# Patient Record
Sex: Female | Born: 1985 | Race: White | Hispanic: No | Marital: Married | Smoking: Never smoker
Health system: Southern US, Community
[De-identification: ages and names within clinical notes are randomized; demographics above are authoritative.]

## PROBLEM LIST (undated history)

## (undated) DIAGNOSIS — R87629 Unspecified abnormal cytological findings in specimens from vagina: Secondary | ICD-10-CM

## (undated) DIAGNOSIS — F419 Anxiety disorder, unspecified: Secondary | ICD-10-CM

## (undated) HISTORY — PX: LEEP: SHX91

## (undated) HISTORY — DX: Unspecified abnormal cytological findings in specimens from vagina: R87.629

---

## 2019-12-25 ENCOUNTER — Emergency Department (HOSPITAL_COMMUNITY): Payer: Medicaid Other

## 2019-12-25 ENCOUNTER — Encounter (HOSPITAL_COMMUNITY): Payer: Self-pay | Admitting: *Deleted

## 2019-12-25 ENCOUNTER — Emergency Department (HOSPITAL_BASED_OUTPATIENT_CLINIC_OR_DEPARTMENT_OTHER): Payer: Medicaid Other

## 2019-12-25 ENCOUNTER — Other Ambulatory Visit (HOSPITAL_COMMUNITY): Payer: Self-pay

## 2019-12-25 ENCOUNTER — Emergency Department (HOSPITAL_COMMUNITY)
Admission: EM | Admit: 2019-12-25 | Discharge: 2019-12-25 | Disposition: A | Discharge status: Home / Self Care | Payer: Medicaid Other | Attending: Emergency Medicine | Admitting: Emergency Medicine

## 2019-12-25 ENCOUNTER — Other Ambulatory Visit: Payer: Self-pay

## 2019-12-25 DIAGNOSIS — I340 Nonrheumatic mitral (valve) insufficiency: Secondary | ICD-10-CM | POA: Diagnosis not present

## 2019-12-25 DIAGNOSIS — R0602 Shortness of breath: Secondary | ICD-10-CM | POA: Insufficient documentation

## 2019-12-25 DIAGNOSIS — F419 Anxiety disorder, unspecified: Secondary | ICD-10-CM | POA: Diagnosis not present

## 2019-12-25 DIAGNOSIS — R0781 Pleurodynia: Secondary | ICD-10-CM | POA: Insufficient documentation

## 2019-12-25 DIAGNOSIS — Z20822 Contact with and (suspected) exposure to covid-19: Secondary | ICD-10-CM | POA: Diagnosis not present

## 2019-12-25 DIAGNOSIS — R012 Other cardiac sounds: Secondary | ICD-10-CM | POA: Insufficient documentation

## 2019-12-25 HISTORY — DX: Anxiety disorder, unspecified: F41.9

## 2019-12-25 LAB — BASIC METABOLIC PANEL
Anion gap: 16 — ABNORMAL HIGH (ref 5–15)
BUN: 13 mg/dL (ref 6–20)
CO2: 21 mmol/L — ABNORMAL LOW (ref 22–32)
Calcium: 10.1 mg/dL (ref 8.9–10.3)
Chloride: 105 mmol/L (ref 98–111)
Creatinine, Ser: 0.79 mg/dL (ref 0.44–1.00)
GFR calc Af Amer: >60 mL/min (ref >60)
GFR calc non Af Amer: >60 mL/min (ref >60)
Glucose, Bld: 138 mg/dL — ABNORMAL HIGH (ref 70–99)
Potassium: 3.5 mmol/L (ref 3.5–5.1)
Sodium: 142 mmol/L (ref 135–145)

## 2019-12-25 LAB — ECHOCARDIOGRAM COMPLETE
Area-P 1/2: 2.8 cm2
Height: 64 in
S' Lateral: 2.8 cm
Weight: 1600 oz

## 2019-12-25 LAB — TSH: TSH: 2.381 u[IU]/mL (ref 0.350–4.500)

## 2019-12-25 LAB — TROPONIN I (HIGH SENSITIVITY)
Troponin I (High Sensitivity): <2 ng/L (ref <18)
Troponin I (High Sensitivity): 2 ng/L (ref <18)

## 2019-12-25 LAB — I-STAT BETA HCG BLOOD, ED (NOT ORDERABLE): I-stat hCG, quantitative: <5 m[IU]/mL (ref <5)

## 2019-12-25 LAB — CBC
HCT: 42.7 % (ref 36.0–46.0)
Hemoglobin: 14.7 g/dL (ref 12.0–15.0)
MCH: 31.8 pg (ref 26.0–34.0)
MCHC: 34.4 g/dL (ref 30.0–36.0)
MCV: 92.4 fL (ref 80.0–100.0)
Platelets: 254 10*3/uL (ref 150–400)
RBC: 4.62 MIL/uL (ref 3.87–5.11)
RDW: 12.2 % (ref 11.5–15.5)
WBC: 9.2 10*3/uL (ref 4.0–10.5)
nRBC: 0 % (ref 0.0–0.2)

## 2019-12-25 LAB — HEPATIC FUNCTION PANEL
ALT: 18 U/L (ref 0–44)
AST: 18 U/L (ref 15–41)
Albumin: 4 g/dL (ref 3.5–5.0)
Alkaline Phosphatase: 49 U/L (ref 38–126)
Bilirubin, Direct: 0.1 mg/dL (ref 0.0–0.2)
Indirect Bilirubin: 0.6 mg/dL (ref 0.3–0.9)
Total Bilirubin: 0.7 mg/dL (ref 0.3–1.2)
Total Protein: 7.2 g/dL (ref 6.5–8.1)

## 2019-12-25 LAB — SARS CORONAVIRUS 2 BY RT PCR (HOSPITAL ORDER, PERFORMED IN ~~LOC~~ HOSPITAL LAB): SARS Coronavirus 2: NEGATIVE

## 2019-12-25 LAB — SEDIMENTATION RATE: Sed Rate: 6 mm/hr (ref 0–22)

## 2019-12-25 LAB — LIPASE, BLOOD: Lipase: 52 U/L — ABNORMAL HIGH (ref 11–51)

## 2019-12-25 LAB — D-DIMER, QUANTITATIVE: D-Dimer, Quant: 0.61 ug/mL-FEU — ABNORMAL HIGH (ref 0.00–0.50)

## 2019-12-25 IMAGING — CR DG CHEST 2V
3 series · 3 of 3 positions shown · non-contrast
Comparison: None.

CLINICAL DATA: Chest pain and shortness of breath with fevers

EXAM:
CHEST - 2 VIEW

[x chest ap (1 of 2)]
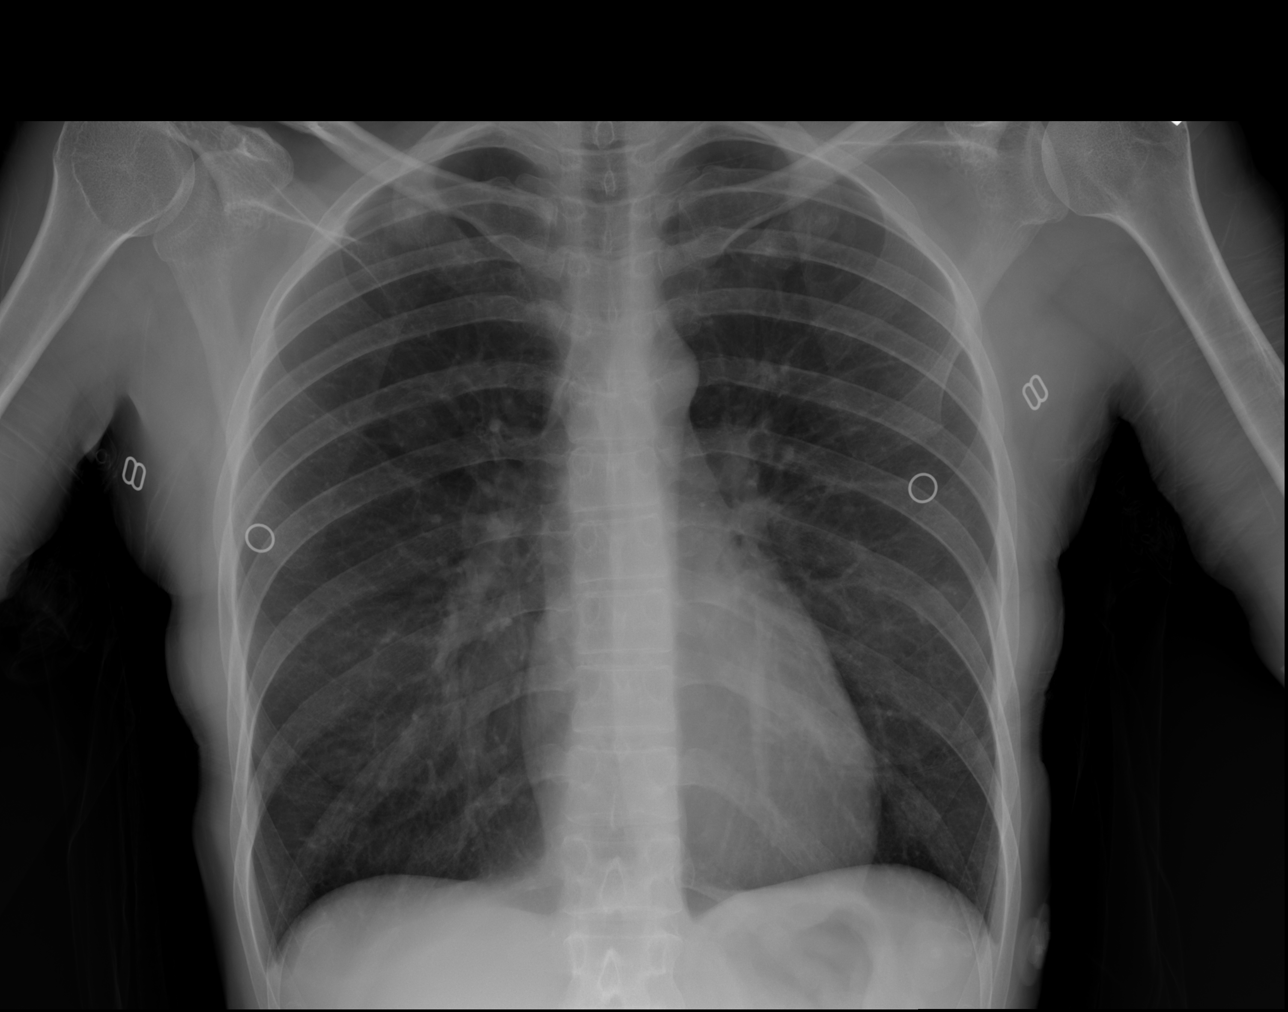

[x chest ap (2 of 2)]
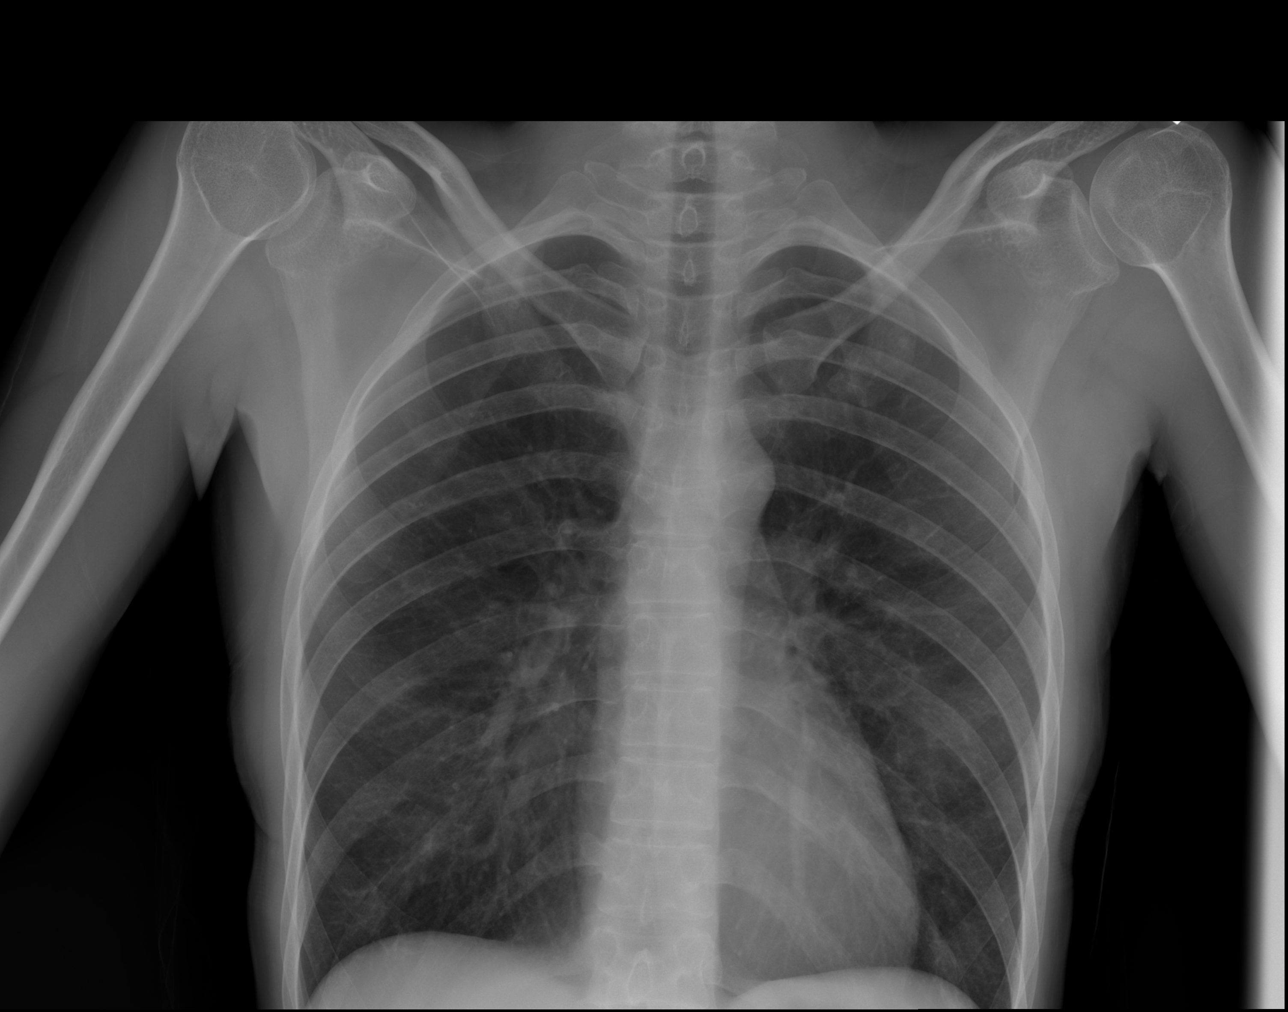

[w chest lat]
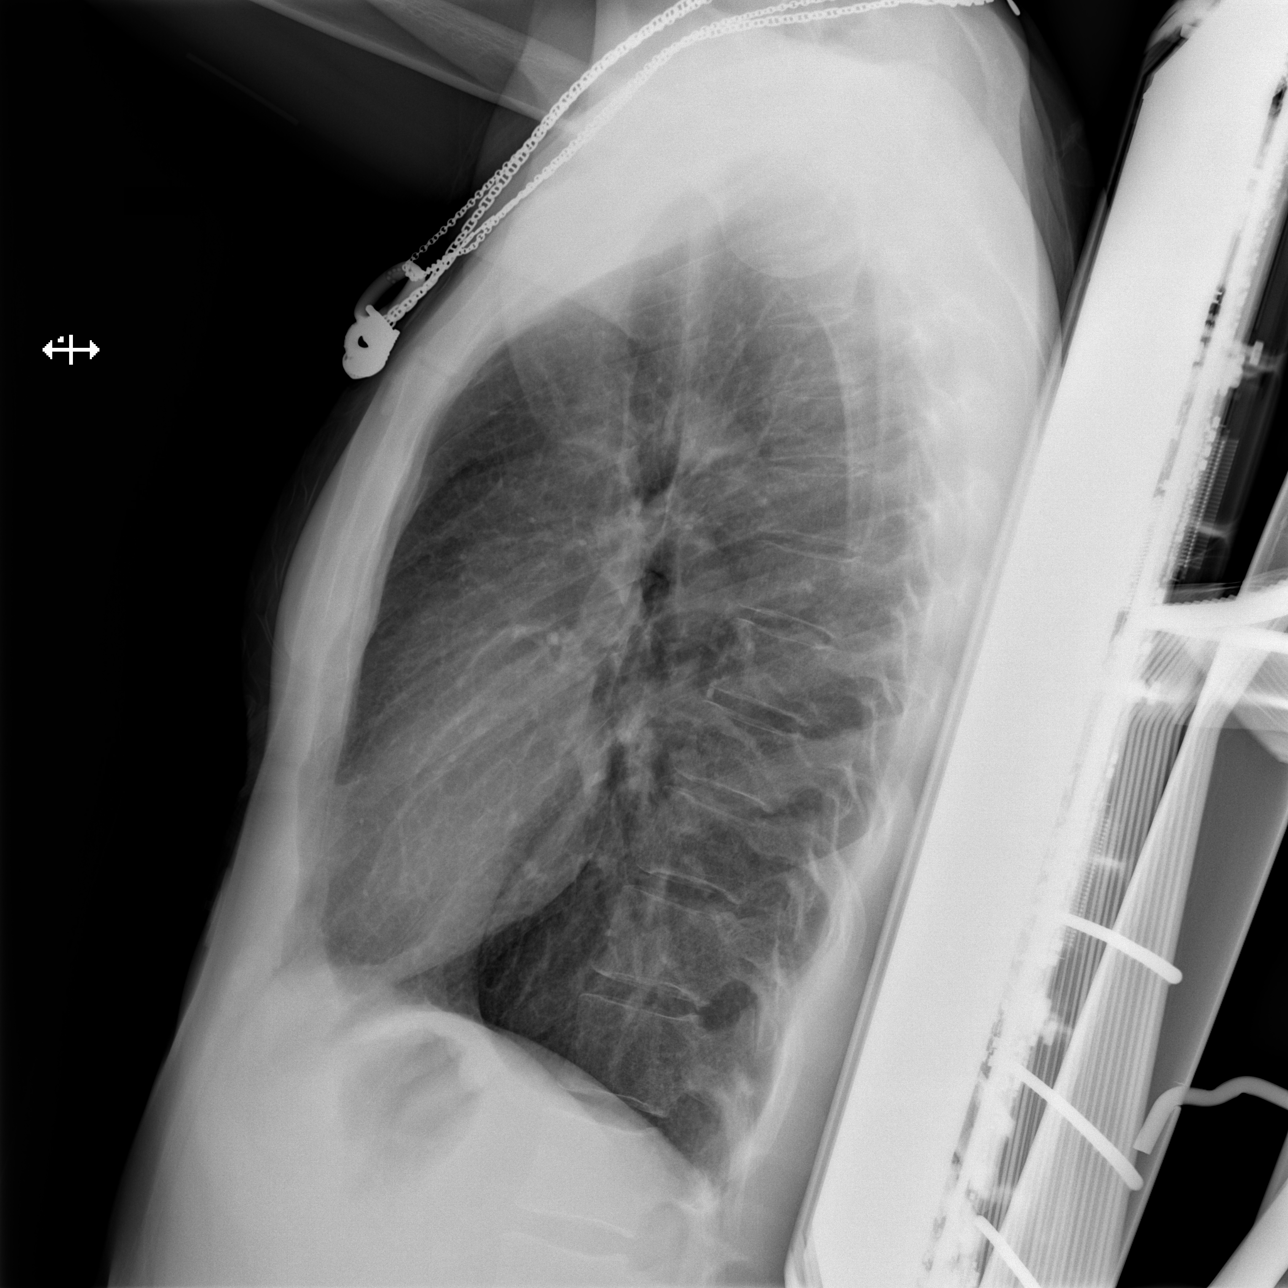

[3 of 3 positions shown; findings below may reference images not displayed]

FINDINGS: The heart size and mediastinal contours are within normal limits.
Both lungs are clear. The visualized skeletal structures are
unremarkable.
IMPRESSION: No active cardiopulmonary disease.

## 2019-12-25 IMAGING — CT CT ANGIO CHEST
2 of 6 series · 18 of 36 positions shown · IV contrast (omnipaque)
Comparison: [DATE]

CLINICAL DATA: Increasing shortness of breath for 2 days, headache,
chest pain

EXAM:
CT ANGIOGRAPHY CHEST WITH CONTRAST
TECHNIQUE: Multidetector CT imaging of the chest was performed using the
standard protocol during bolus administration of intravenous
contrast. Multiplanar CT image reconstructions and MIPs were
obtained to evaluate the vascular anatomy.
CONTRAST:  100mL OMNIPAQUE IOHEXOL 350 MG/ML SOLN

[Series 6: thins · axial · 0.62mm/px · z∈[+1410,+1682]mm · 17 of 308 slices shown]
[im 18/308  lung]
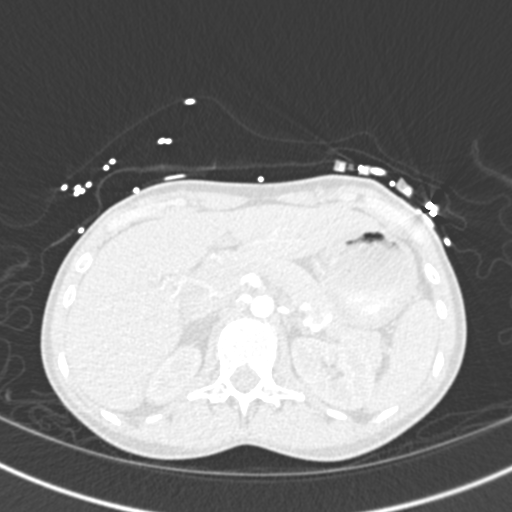
[im 35/308  mediastinal]
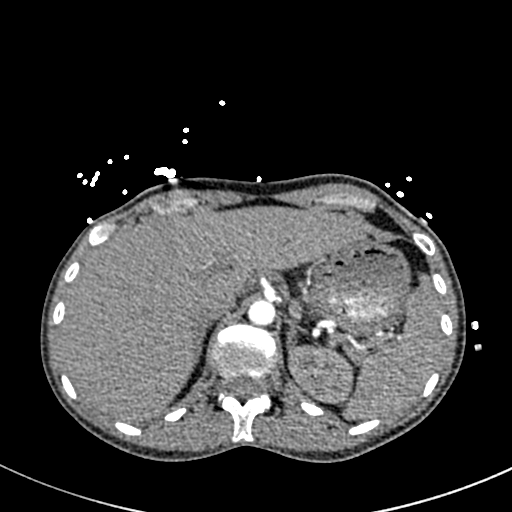
[im 52/308  lung]
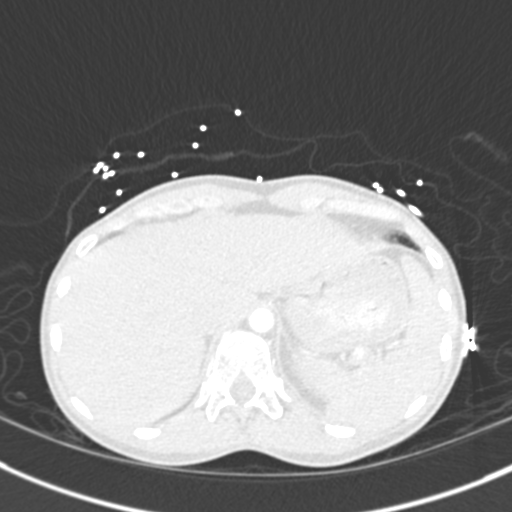
[im 69/308  mediastinal]
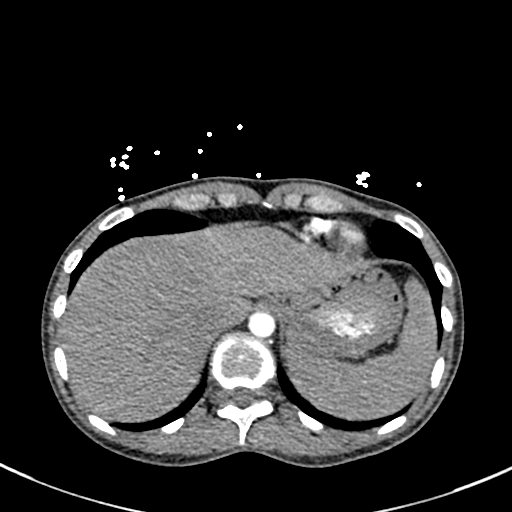
[im 86/308  lung]
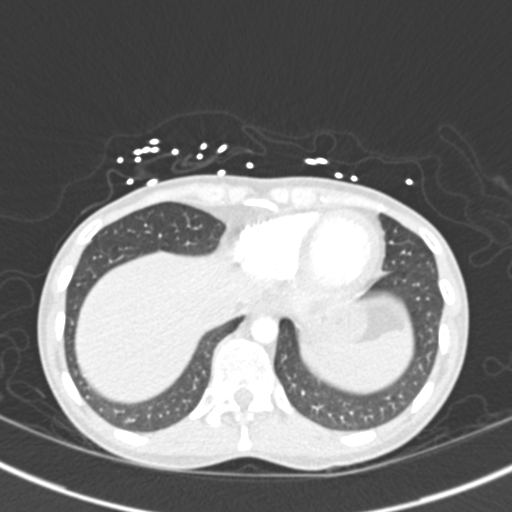
[im 103/308  mediastinal]
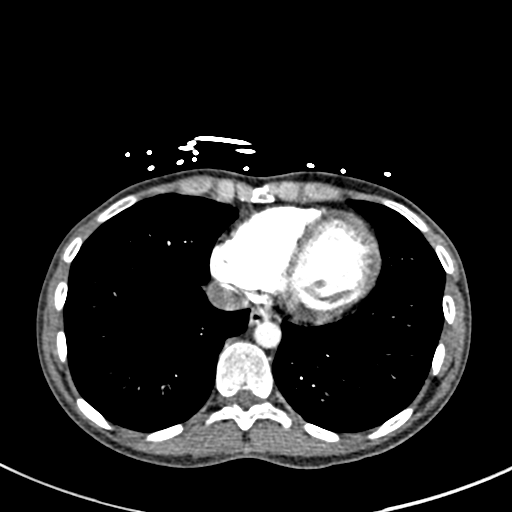
[im 120/308  lung]
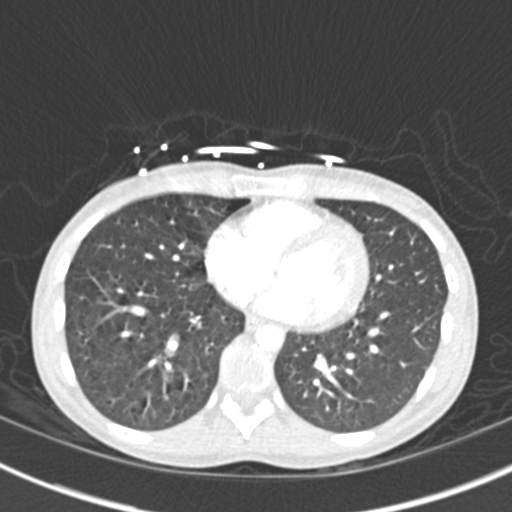
[im 137/308  mediastinal]
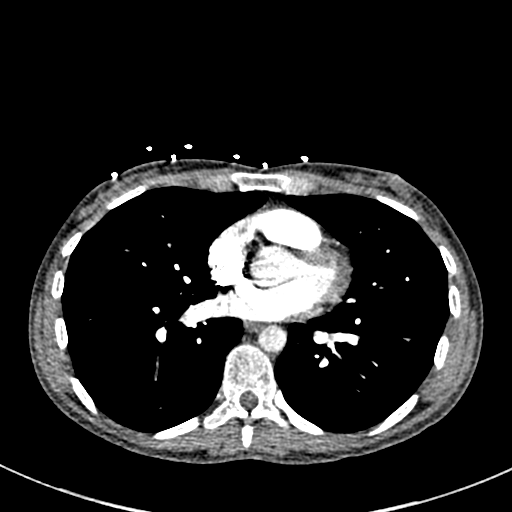
[im 154/308  lung]
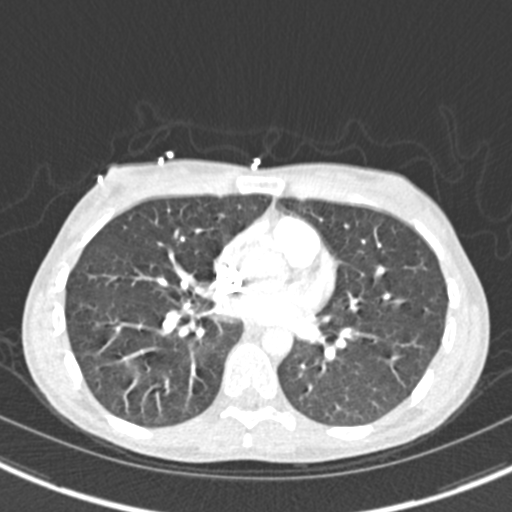
[im 171/308  mediastinal]
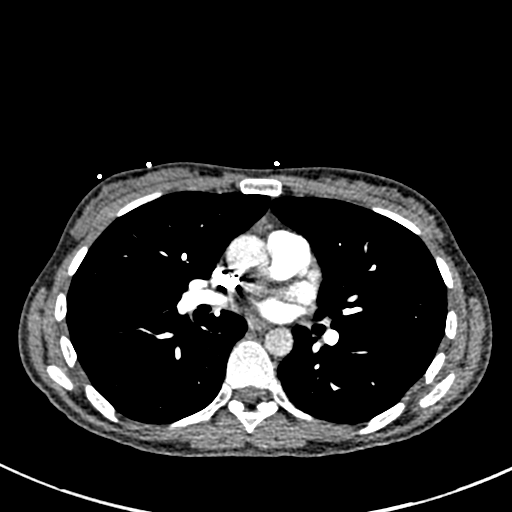
[im 188/308  lung]
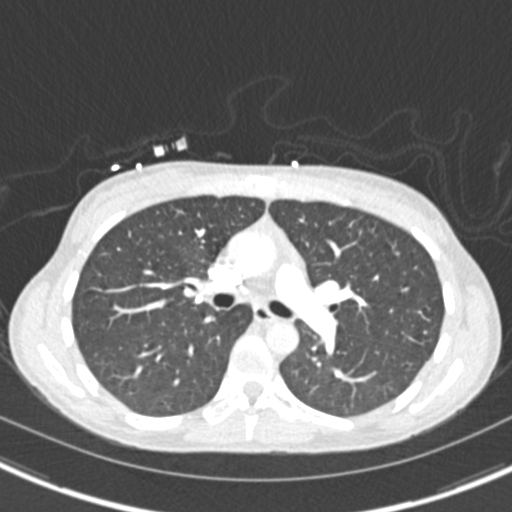
[im 205/308  mediastinal]
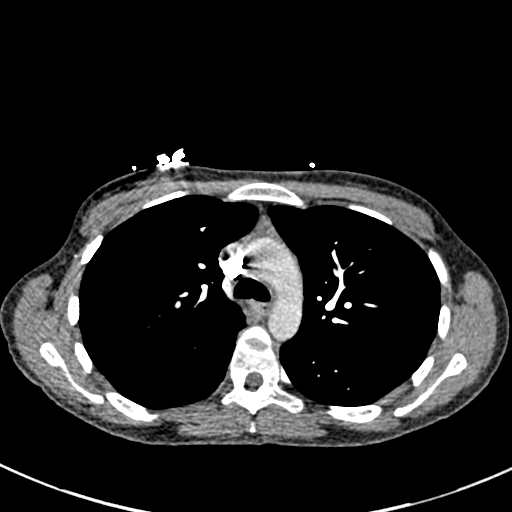
[im 222/308  lung]
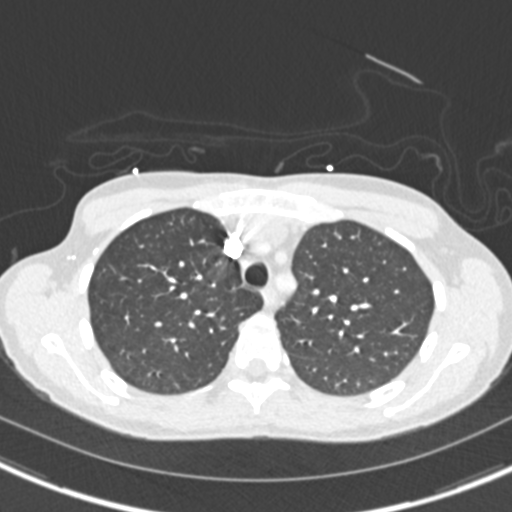
[im 239/308  mediastinal]
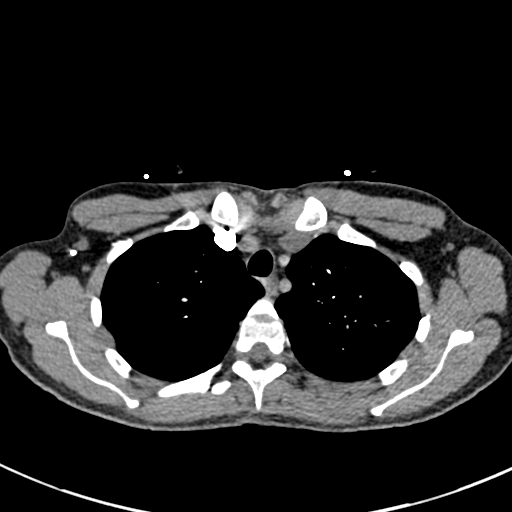
[im 256/308  lung]
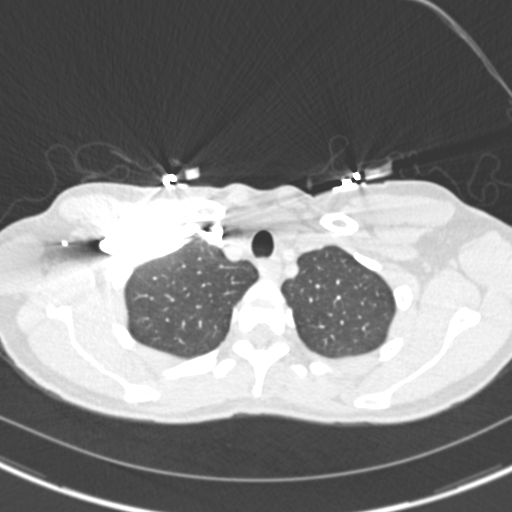
[im 273/308  mediastinal]
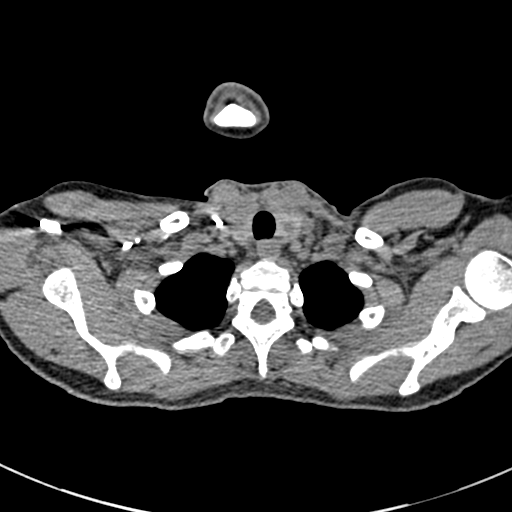
[im 290/308  lung]
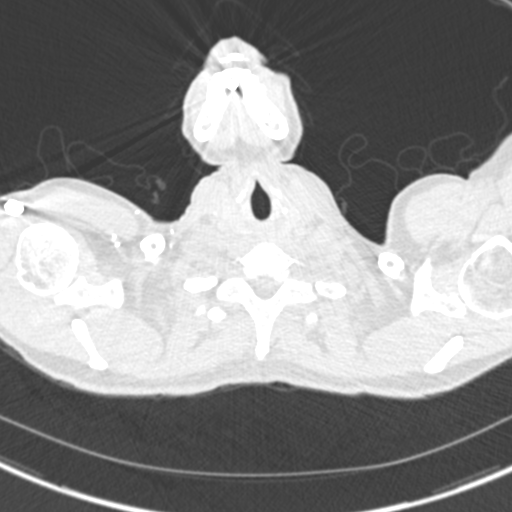

[Series 8: coronal mpr · coronal · 0.54mm/px · 1 of 94 slices shown]
[im 47/94  mediastinal]
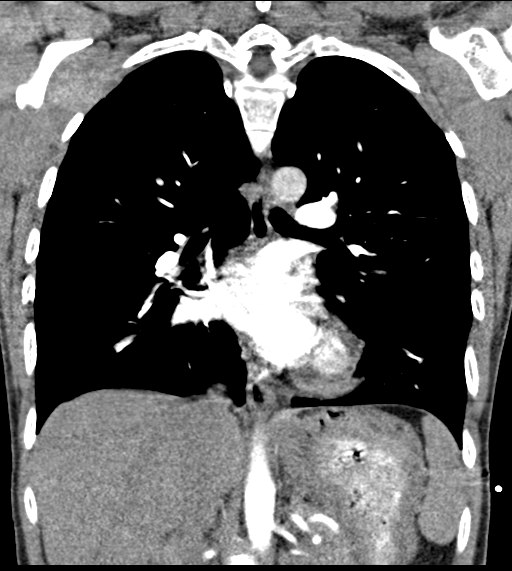

[18 of 36 positions shown; findings below may reference images not displayed]

FINDINGS: Cardiovascular: This is a technically adequate evaluation of the
pulmonary vasculature. No filling defects or pulmonary emboli.

The heart is unremarkable without pericardial effusion. Normal
caliber of the thoracic aorta.

Mediastinum/Nodes: No enlarged mediastinal, hilar, or axillary lymph
nodes. Thyroid gland, trachea, and esophagus demonstrate no
significant findings.

Lungs/Pleura: No acute airspace disease, effusion, or pneumothorax.
Central airways are widely patent.

Upper Abdomen: No acute abnormality.

Musculoskeletal: No acute or destructive bony lesions. Reconstructed
images demonstrate no additional findings.

Review of the MIP images confirms the above findings.
IMPRESSION: 1. No evidence of pulmonary embolus.
2. No acute intrathoracic process.

## 2019-12-25 MED ORDER — OMEPRAZOLE 20 MG PO CPDR: 20.0000 mg | DELAYED_RELEASE_CAPSULE | Freq: Every day | ORAL | 0 refills | Status: DC

## 2019-12-25 MED ORDER — LORAZEPAM 2 MG/ML IJ SOLN
0.5000 mg | Freq: Once | INTRAMUSCULAR | Status: AC
  Administered 2019-12-25: 0.5 mg via INTRAVENOUS
  Filled 2019-12-25: qty 1

## 2019-12-25 MED ORDER — IOHEXOL 350 MG/ML SOLN
100.0000 mL | Freq: Once | INTRAVENOUS | Status: AC | PRN
  Administered 2019-12-25: 100 mL via INTRAVENOUS

## 2019-12-25 MED ORDER — FAMOTIDINE IN NACL 20-0.9 MG/50ML-% IV SOLN
20.0000 mg | Freq: Once | INTRAVENOUS | Status: AC
  Administered 2019-12-25: 20 mg via INTRAVENOUS
  Filled 2019-12-25: qty 50

## 2019-12-25 MED ORDER — KETOROLAC TROMETHAMINE 30 MG/ML IJ SOLN
30.0000 mg | Freq: Once | INTRAMUSCULAR | Status: AC
  Administered 2019-12-25: 30 mg via INTRAVENOUS
  Filled 2019-12-25: qty 1

## 2019-12-25 NOTE — ED Provider Notes (Signed)
[facility]-EMERGENCY DEPT Provider Note   CSN: [id number] Arrival date & time: 12/25/19  7829     History Chief Complaint  Patient presents with  . Shortness of Breath    Susan Neal is a 34 y.o. female.  HPI Patient has no significant past medical history.  She reports she developed shortness of breath over the past couple of days.  She thought it was because she is just so busy and active.  However, it has become much more intense and she is developed heavy chest pain just to the right of the sternum.  She reports when she takes a deep breath or exerts herself at all it feels like there is a deep pressure on her chest.  She reports since last night also she has been getting tingling and numb feeling in all 4 extremities.  She tries to stretch and move them to alleviate this sensation.  She reports she is felt really cold but she has not had a fever.  She reports when other people touch her she feels warm to them but to herself her extremities feel cold.  He had called urgent care and they thought maybe it was due to her Nexplanon.  She has had this Nexplanon for 2 years.  She has had 3 of them and never had problems previously.  She reports they can think of another reason for her to have the symptoms all of a sudden.  Patient reports she and her family are extremely careful with isolating to avoid contracting Covid.  He has a son who has pseudotumor cerebri and she is very careful with exposures.  However, due to concerns for safety of the vaccine, she has not been vaccinated for Covid.  Patient has not had any myalgias, diarrhea, vomiting or general URI symptoms.  Non-smoker.  Very rare alcohol use.    Past Medical History:  Diagnosis Date  . Anxiety     There are no problems to display for this patient.   Past Surgical History:  Procedure Laterality Date  . leap       OB History   No obstetric history on file.     No family history on  file.  Social History   Tobacco Use  . Smoking status: Never Smoker  . Smokeless tobacco: Never Used  Substance Use Topics  . Alcohol use: Yes  . Drug use: Never    Home Medications Prior to Admission medications   Medication Sig Start Date End Date Taking? Authorizing Provider  omeprazole (PRILOSEC) 20 MG capsule Take 1 capsule (20 mg total) by mouth daily. 12/25/19   Arby Barrette, MD    Allergies    Tramadol  Review of Systems   Review of Systems 10 systems reviewed and negative except as per HPI Physical Exam Updated Vital Signs BP 111/78   Pulse 85   Temp 98.4 F (36.9 C) (Oral)   Resp (!) 21   Ht  (1.626 m)   Wt 45.4 kg   SpO2 98%   BMI 17.16 kg/m   Physical Exam Constitutional:      Comments: Patient is alert.  Well-nourished well-developed.  Good physical condition.  Mildly anxious in appearance.  Nontoxic.  HENT:     Head: Normocephalic and atraumatic.     Mouth/Throat:     Pharynx: Oropharynx is clear.  Eyes:     Extraocular Movements: Extraocular movements intact.  Cardiovascular:     Comments: Borderline tachycardia.  Pronounced split  S2.  Radial pulse is symmetric but 1+, feel slightly soft relative to patient's age and physical condition.  Pedal pulses also slightly soft to palpation.  1+. Pulmonary:     Breath sounds: Normal breath sounds.     Comments: Mild tachypnea. Abdominal:     General: There is no distension.     Palpations: Abdomen is soft.     Tenderness: There is no abdominal tenderness. There is no guarding.  Musculoskeletal:        General: No swelling or tenderness. Normal range of motion.     Cervical back: Neck supple.     Right lower leg: No edema.     Left lower leg: No edema.  Skin:    General: Skin is warm and dry.  Neurological:     General: No focal deficit present.     Mental Status: She is oriented to person, place, and time.     Cranial Nerves: No cranial nerve deficit.     Coordination: Coordination  normal.  Psychiatric:     Comments: Mildly anxious.     ED Results / Procedures / Treatments   Labs (all labs ordered are listed, but only abnormal results are displayed) Labs Reviewed  BASIC METABOLIC PANEL - Abnormal; Notable for the following components:      Result Value   CO2 21 (*)    Glucose, Bld 138 (*)    Anion gap 16 (*)    All other components within normal limits  D-DIMER, QUANTITATIVE (NOT AT [facility]) - Abnormal; Notable for the following components:   D-Dimer, Quant 0.61 (*)    All other components within normal limits  LIPASE, BLOOD - Abnormal; Notable for the following components:   Lipase 52 (*)    All other components within normal limits  SARS CORONAVIRUS 2 BY RT PCR (HOSPITAL ORDER, PERFORMED IN [facility])  CBC  TSH  SEDIMENTATION RATE  HEPATIC FUNCTION PANEL  I-STAT BETA HCG BLOOD, ED ([facility], [facility], [facility] ONLY)  I-STAT BETA HCG BLOOD, ED (NOT ORDERABLE)  TROPONIN I (HIGH SENSITIVITY)  TROPONIN I (HIGH SENSITIVITY)    EKG EKG Interpretation  Date/Time:  Wednesday December 25 2019 16:13:01 EDT Ventricular Rate:  81 PR Interval:    QRS Duration: 87 QT Interval:  397 QTC Calculation: 461 R Axis:   77 Text Interpretation: Sinus rhythm Borderline ST depression, anterolateral leads Baseline wander in lead(s) V5 V6 agree, no old comparison Confirmed by Arby BarrettePfeiffer, Eilidh Marcano [phone]([id number]) on 12/25/2019 4:25:34 PM   Radiology DG Chest 2 View  Result Date: 12/25/2019 CLINICAL DATA:  Chest pain and shortness of breath with fevers EXAM: CHEST - 2 VIEW COMPARISON:  None. FINDINGS: The heart size and mediastinal contours are within normal limits. Both lungs are clear. The visualized skeletal structures are unremarkable. IMPRESSION: No active cardiopulmonary disease. Electronically Signed   By: Alcide CleverMark  Lukens M.D.   On: 12/25/2019 09:43   CT Angio Chest PE W/Cm &/Or Wo Cm  Result Date: 12/25/2019 CLINICAL DATA:  Increasing shortness of breath for 2 days, headache, chest  pain EXAM: CT ANGIOGRAPHY CHEST WITH CONTRAST TECHNIQUE: Multidetector CT imaging of the chest was performed using the standard protocol during bolus administration of intravenous contrast. Multiplanar CT image reconstructions and MIPs were obtained to evaluate the vascular anatomy. CONTRAST:  100mL OMNIPAQUE IOHEXOL 350 MG/ML SOLN COMPARISON:  12/25/2019 FINDINGS: Cardiovascular: This is a technically adequate evaluation of the pulmonary vasculature. No filling defects or pulmonary emboli. The heart is unremarkable without pericardial  effusion. Normal caliber of the thoracic aorta. Mediastinum/Nodes: No enlarged mediastinal, hilar, or axillary lymph nodes. Thyroid gland, trachea, and esophagus demonstrate no significant findings. Lungs/Pleura: No acute airspace disease, effusion, or pneumothorax. Central airways are widely patent. Upper Abdomen: No acute abnormality. Musculoskeletal: No acute or destructive bony lesions. Reconstructed images demonstrate no additional findings. Review of the MIP images confirms the above findings. IMPRESSION: 1. No evidence of pulmonary embolus. 2. No acute intrathoracic process. Electronically Signed   By: Sharlet Salina M.D.   On: 12/25/2019 20:56   ECHOCARDIOGRAM COMPLETE  Result Date: 12/25/2019    ECHOCARDIOGRAM REPORT   Patient Name:   Susan Neal Date of Exam: 12/25/2019 Medical Rec #:  [id number]        Height:       64.0 in Accession #:    [id number]       Weight:       100.0 lb Date of Birth:  02-Oct-1985         BSA:          1.457 m Patient Age:    34 years         BP:           121/82 mmHg Patient Gender: F                HR:           79 bpm. Exam Location:  Inpatient Procedure: 2D Echo STAT ECHO Indications:    chest pain  History:        Patient has no prior history of Echocardiogram examinations.  Sonographer:    Celene Skeen RDCS (AE) Referring Phys: [id number] [facility] Cheyrl Buley  Sonographer Comments: thin body habitus IMPRESSIONS  1. Left ventricular ejection  fraction, by estimation, is 60 to 65%. The left ventricle has normal function. The left ventricle has no regional wall motion abnormalities. Left ventricular diastolic parameters were normal.  2. Right ventricular systolic function is normal. The right ventricular size is normal.  3. The mitral valve is normal in structure. Mild mitral valve regurgitation. No evidence of mitral stenosis.  4. The aortic valve is normal in structure. Aortic valve regurgitation is not visualized. No aortic stenosis is present. FINDINGS  Left Ventricle: Left ventricular ejection fraction, by estimation, is 60 to 65%. The left ventricle has normal function. The left ventricle has no regional wall motion abnormalities. The left ventricular internal cavity size was normal in size. There is  no left ventricular hypertrophy. Left ventricular diastolic parameters were normal. Right Ventricle: The right ventricular size is normal. No increase in right ventricular wall thickness. Right ventricular systolic function is normal. Left Atrium: Left atrial size was normal in size. Right Atrium: Right atrial size was normal in size. Pericardium: There is no evidence of pericardial effusion. Mitral Valve: The mitral valve is normal in structure. Mild mitral valve regurgitation. No evidence of mitral valve stenosis. Tricuspid Valve: The tricuspid valve is grossly normal. Tricuspid valve regurgitation is not demonstrated. No evidence of tricuspid stenosis. Aortic Valve: The aortic valve is normal in structure. Aortic valve regurgitation is not visualized. No aortic stenosis is present. Pulmonic Valve: The pulmonic valve was normal in structure. Pulmonic valve regurgitation is not visualized. Aorta: The aortic root and ascending aorta are structurally normal, with no evidence of dilitation. IAS/Shunts: The atrial septum is grossly normal.  LEFT VENTRICLE PLAX 2D LVIDd:         4.30 cm  Diastology LVIDs:  2.80 cm  LV e' medial:    9.03 cm/s LV PW:          0.70 cm  LV E/e' medial:  8.0 LV IVS:        0.60 cm  LV e' lateral:   14.00 cm/s LVOT diam:     1.65 cm  LV E/e' lateral: 5.1 LV SV:         35 LV SV Index:   24 LVOT Area:     2.14 cm  RIGHT VENTRICLE RV S prime:     12.90 cm/s TAPSE (M-mode): 1.9 cm LEFT ATRIUM           Index      RIGHT ATRIUM          Index LA diam:      2.10 cm 1.44 cm/m RA Area:     7.72 cm LA Vol (A4C): 10.9 ml 7.48 ml/m RA Volume:   12.30 ml 8.44 ml/m  AORTIC VALVE LVOT Vmax:   94.25 cm/s LVOT Vmean:  61.000 cm/s LVOT VTI:    0.163 m  AORTA Ao Root diam: 2.50 cm MITRAL VALVE MV Area (PHT): 2.80 cm    SHUNTS MV Decel Time: 271 msec    Systemic VTI:  0.16 m MV E velocity: 72.00 cm/s  Systemic Diam: 1.65 cm MV A velocity: 72.40 cm/s MV E/A ratio:  0.99 Kristeen Miss MD Electronically signed by Kristeen Miss MD Signature Date/Time: 12/25/2019/5:04:49 PM    Final     Procedures Procedures (including critical care time)  Medications Ordered in ED Medications  famotidine (PEPCID) IVPB 20 mg premix (0 mg Intravenous Stopped 12/25/19 2103)  LORazepam (ATIVAN) injection 0.5 mg (0.5 mg Intravenous Given 12/25/19 1942)  iohexol (OMNIPAQUE) 350 MG/ML injection 100 mL (100 mLs Intravenous Contrast Given 12/25/19 2041)  ketorolac (TORADOL) 30 MG/ML injection 30 mg (30 mg Intravenous Given 12/25/19 2128)    ED Course  I have reviewed the triage vital signs and the nursing notes.  Pertinent labs & imaging results that were available during my care of the patient were reviewed by me and considered in my medical decision making (see chart for details).    MDM Rules/Calculators/A&P                         Patient has been developing short of breath for 2 days.  She has not had fever or productive cough.  She has developed significant pressure like chest pain in the anterior chest with deep breath and exertion.  Physical exam is for clear lungs.  Patient heart sounds however do have pronounced split S2.  Consideration of physiologic  finding however with patient's symptoms of significant chest pain and shortness of breath, will proceed with cardiac echo for valvular evaluation and rule out pericardial effusion.  Chest x-ray does not show cardiomegaly.  Patient is not hypotensive or hypoxic at rest.  At this time, stable for further diagnostic evaluation.  Will also add Covid testing.  Diagnostic evaluation is within normal limits.  CT chest does not show any abnormal findings.  Combined findings of echo and CT, very low probability of any cardiac or pulmonary etiology.  Patient may be experiencing reflux and esophageal spasm or possibly have gastritis or ulcer disease.  Recommend starting omeprazole daily and following dietary recommendations for reflux.  Patient is also instructed to take Tylenol for chest wall pain.  Symptoms of tingling of the hands and feet all the  same time is consistent with hyperventilation.  Patient is counseled on breathing patterns.  At this time patient is stable for discharge.  We have discussed a detailed plan for symptomatic treatment and necessity for establishing follow-up for further diagnostic evaluation and recheck.  Return precautions reviewed.  Susan Neal was evaluated in Emergency Department on 12/25/2019 for the symptoms described in the history of present illness. She was evaluated in the context of the global COVID-19 pandemic, which necessitated consideration that the patient might be at risk for infection with the SARS-CoV-2 virus that causes COVID-19. Institutional protocols and algorithms that pertain to the evaluation of patients at risk for COVID-19 are in a state of rapid change based on information released by regulatory bodies including the CDC and federal and state organizations. These policies and algorithms were followed during the patient's care in the ED.   Final Clinical Impression(s) / ED Diagnoses Final diagnoses:  Shortness of breath  Pleuritic pain  Split S2 (second  heart sound)    Rx / DC Orders ED Discharge Orders         Ordered    omeprazole (PRILOSEC) 20 MG capsule  Daily        12/25/19 2146           Arby Barrette, MD 12/25/19 2157

## 2019-12-25 NOTE — Discharge Instructions (Signed)
1.  Take omeprazole in the morning 30 minutes before you eat.  Eat a low-fat nutritious breakfast. 2.  Follow eating instructions for gastroesophageal reflux disease. 3.  You may take extra strength Tylenol every 6 hours for pain. 4.  Set up an appointment with a family doctor for recheck and referral if needed for upper endoscopy.  If you have significant gastritis or an ulcer, this will need to be diagnosed by a gastroenterologist doing a procedure called an upper endoscopy.  You are sedated and a lighted scope is used to examine the lining of the esophagus and the stomach. 5.  You had an echocardiogram and a CT scan of the chest done in the emergency department.  Both of these studies were normal.  If you have persisting pain or new symptoms, you may need additional diagnostic evaluation.  Make sure you get established with family doctor for appropriate referrals.  If your symptoms are worsening or changing, return to the emergency department.

## 2019-12-25 NOTE — ED Notes (Signed)
ECHO at bedside.

## 2019-12-25 NOTE — Progress Notes (Signed)
  Echocardiogram 2D Echocardiogram has been performed.  Celene Skeen 12/25/2019, 4:45 PM

## 2019-12-25 NOTE — ED Triage Notes (Signed)
Patient reports for the past 2 days she has noted increased sob.  She states she has been busy the past 2 days and thought her sx were related to being so busy.  Last night she developed a headache, chest pain, sob, tingling, numbness in both arms and legs.  Patient is alert.  She is noted to be sob at rest.  Patient denies any fever but states she has felt really cold.  Patient has had no cough, no n/v.  Patient states her legs feel weak or wobbly.  She does have implant BC in the left arm

## 2020-04-11 NOTE — L&D Delivery Note (Signed)
Patient was C/C/0 and pushed for approx 5 minutes with epidural.    NSVD female infant, Apgars 9/9 weight pending.  The patient had bilateral labial abrasions not requiring repair. Fundus was firm. EBL was expected amount. Placenta was delivered intact. Vagina was clear.  Delayed cord clamping done for 30-60 seconds while warming baby. Baby was vigorous and doing skin to skin with mother.  Susan Neal

## 2020-04-28 LAB — OB RESULTS CONSOLE HIV ANTIBODY (ROUTINE TESTING): HIV: NONREACTIVE

## 2020-04-28 LAB — OB RESULTS CONSOLE ANTIBODY SCREEN: Antibody Screen: NEGATIVE

## 2020-04-28 LAB — OB RESULTS CONSOLE ABO/RH: RH Type: POSITIVE

## 2020-04-28 LAB — OB RESULTS CONSOLE HEPATITIS B SURFACE ANTIGEN: Hepatitis B Surface Ag: NEGATIVE

## 2020-04-28 LAB — OB RESULTS CONSOLE RPR: RPR: NONREACTIVE

## 2020-04-28 LAB — OB RESULTS CONSOLE RUBELLA ANTIBODY, IGM: Rubella: IMMUNE

## 2020-06-11 ENCOUNTER — Other Ambulatory Visit: Payer: Self-pay | Admitting: Obstetrics and Gynecology

## 2020-06-11 DIAGNOSIS — Z363 Encounter for antenatal screening for malformations: Secondary | ICD-10-CM

## 2020-07-02 ENCOUNTER — Emergency Department (HOSPITAL_COMMUNITY)
Admission: EM | Admit: 2020-07-02 | Discharge: 2020-07-03 | Disposition: A | Discharge status: Home / Self Care | Payer: Medicaid Other | Attending: Emergency Medicine | Admitting: Emergency Medicine

## 2020-07-02 ENCOUNTER — Emergency Department (HOSPITAL_COMMUNITY): Payer: Medicaid Other

## 2020-07-02 ENCOUNTER — Other Ambulatory Visit: Payer: Self-pay

## 2020-07-02 ENCOUNTER — Encounter (HOSPITAL_COMMUNITY): Payer: Self-pay

## 2020-07-02 DIAGNOSIS — R000 Tachycardia, unspecified: Secondary | ICD-10-CM | POA: Insufficient documentation

## 2020-07-02 DIAGNOSIS — R55 Syncope and collapse: Secondary | ICD-10-CM | POA: Insufficient documentation

## 2020-07-02 DIAGNOSIS — K469 Unspecified abdominal hernia without obstruction or gangrene: Secondary | ICD-10-CM | POA: Diagnosis not present

## 2020-07-02 DIAGNOSIS — R1031 Right lower quadrant pain: Secondary | ICD-10-CM

## 2020-07-02 DIAGNOSIS — O26892 Other specified pregnancy related conditions, second trimester: Secondary | ICD-10-CM | POA: Insufficient documentation

## 2020-07-02 DIAGNOSIS — Z3A18 18 weeks gestation of pregnancy: Secondary | ICD-10-CM | POA: Diagnosis not present

## 2020-07-02 LAB — CBC WITH DIFFERENTIAL/PLATELET
Abs Immature Granulocytes: 0.07 10*3/uL (ref 0.00–0.07)
Basophils Absolute: 0 10*3/uL (ref 0.0–0.1)
Basophils Relative: 0 %
Eosinophils Absolute: 0.1 10*3/uL (ref 0.0–0.5)
Eosinophils Relative: 1 %
HCT: 33 % — ABNORMAL LOW (ref 36.0–46.0)
Hemoglobin: 11.4 g/dL — ABNORMAL LOW (ref 12.0–15.0)
Immature Granulocytes: 1 %
Lymphocytes Relative: 15 %
Lymphs Abs: 1.7 10*3/uL (ref 0.7–4.0)
MCH: 32.8 pg (ref 26.0–34.0)
MCHC: 34.5 g/dL (ref 30.0–36.0)
MCV: 94.8 fL (ref 80.0–100.0)
Monocytes Absolute: 0.6 10*3/uL (ref 0.1–1.0)
Monocytes Relative: 5 %
Neutro Abs: 8.6 10*3/uL — ABNORMAL HIGH (ref 1.7–7.7)
Neutrophils Relative %: 78 %
Platelets: 252 10*3/uL (ref 150–400)
RBC: 3.48 MIL/uL — ABNORMAL LOW (ref 3.87–5.11)
RDW: 14.6 % (ref 11.5–15.5)
WBC: 11 10*3/uL — ABNORMAL HIGH (ref 4.0–10.5)
nRBC: 0 % (ref 0.0–0.2)

## 2020-07-02 LAB — COMPREHENSIVE METABOLIC PANEL
ALT: 14 U/L (ref 0–44)
AST: 19 U/L (ref 15–41)
Albumin: 4 g/dL (ref 3.5–5.0)
Alkaline Phosphatase: 47 U/L (ref 38–126)
Anion gap: 8 (ref 5–15)
BUN: 10 mg/dL (ref 6–20)
CO2: 20 mmol/L — ABNORMAL LOW (ref 22–32)
Calcium: 9.2 mg/dL (ref 8.9–10.3)
Chloride: 105 mmol/L (ref 98–111)
Creatinine, Ser: 0.57 mg/dL (ref 0.44–1.00)
GFR, Estimated: >60 mL/min (ref >60)
Glucose, Bld: 105 mg/dL — ABNORMAL HIGH (ref 70–99)
Potassium: 3.7 mmol/L (ref 3.5–5.1)
Sodium: 133 mmol/L — ABNORMAL LOW (ref 135–145)
Total Bilirubin: 0.6 mg/dL (ref 0.3–1.2)
Total Protein: 7.9 g/dL (ref 6.5–8.1)

## 2020-07-02 LAB — CBG MONITORING, ED: Glucose-Capillary: 95 mg/dL (ref 70–99)

## 2020-07-02 IMAGING — US US ART/VEN ABD/PELV/SCROTUM DOPPLER LTD
1 series · 13 of 25 positions shown · non-contrast
Comparison: none

CLINICAL DATA: 35-year-old pregnant female with right lower
quadrant abdominal pain. Concern for ovarian torsion. Provided
gestational age of 18 weeks, 4 days.

EXAM:
LIMITED OBSTETRIC ULTRASOUND AN DOPPLER

[Series 1: us art/ven abd/pelv/scrotum doppler ltd · 13 of 58 slices shown]
[im 1/58]
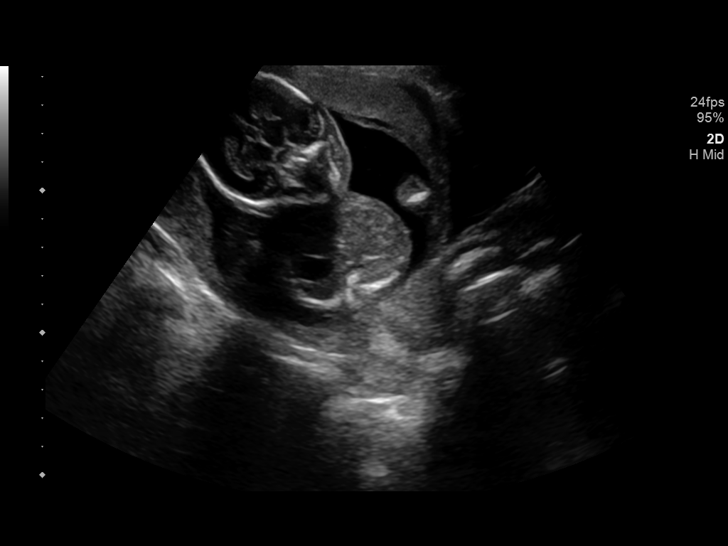
[im 5/58]
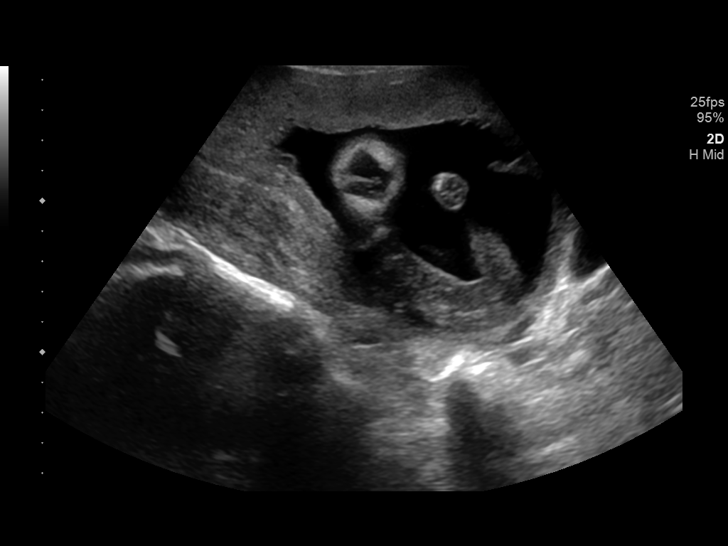
[im 10/58]
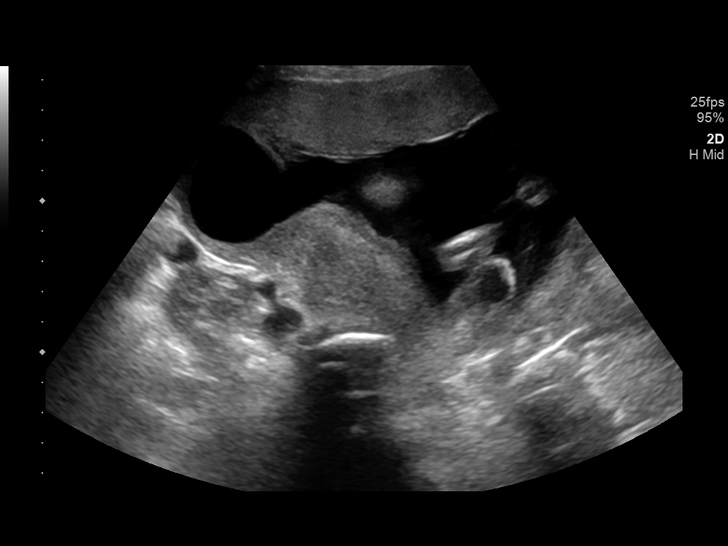
[im 15/58]
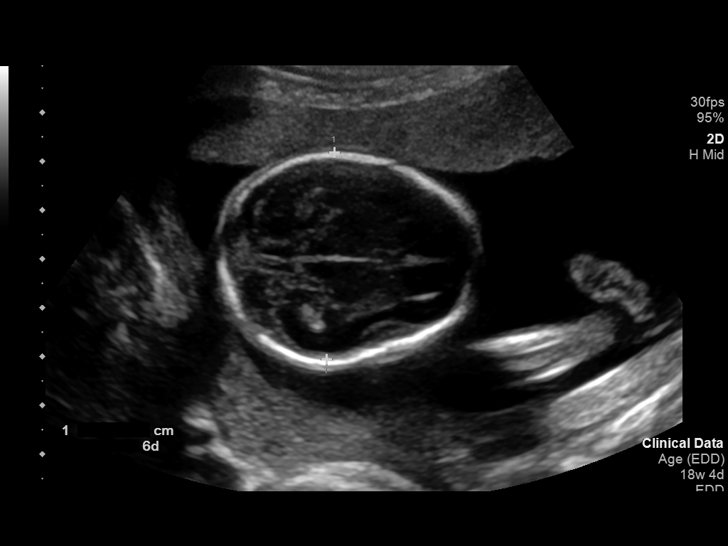
[im 20/58]
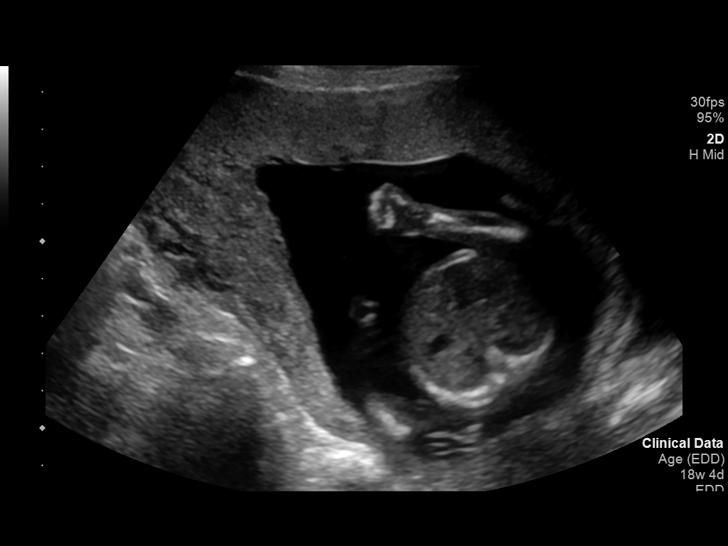
[im 24/58]
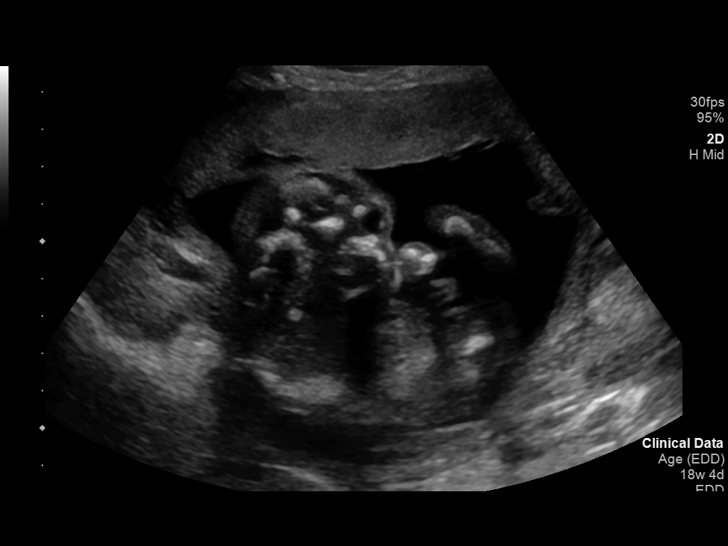
[im 29/58]
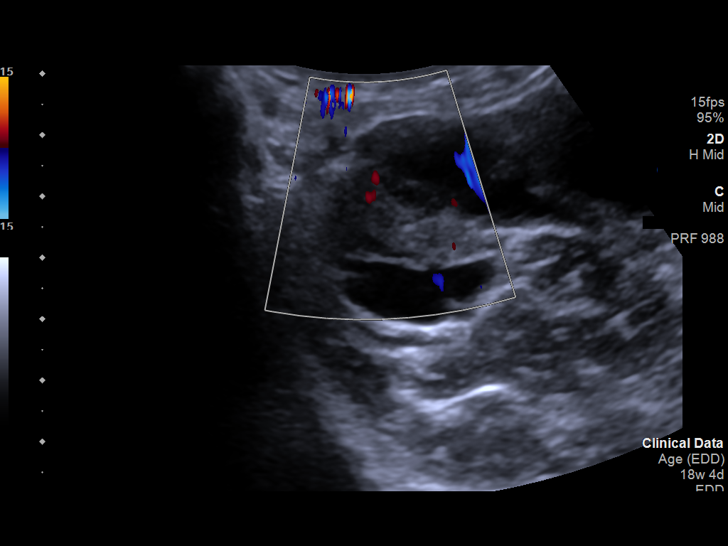
[im 34/58]
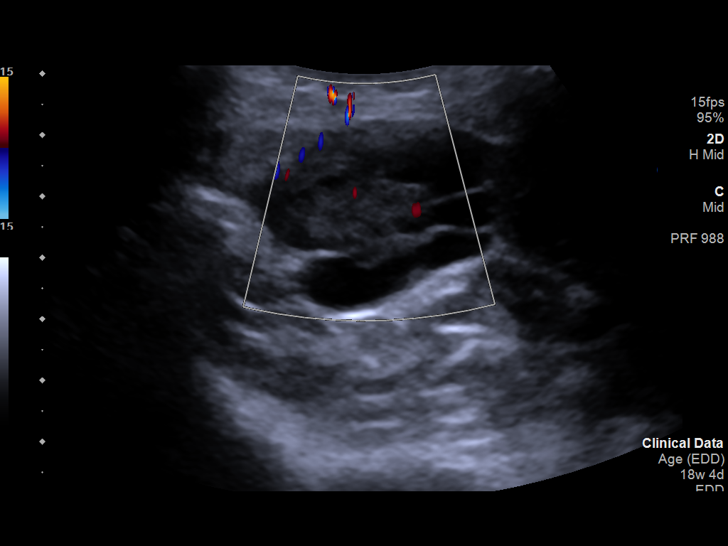
[im 39/58]
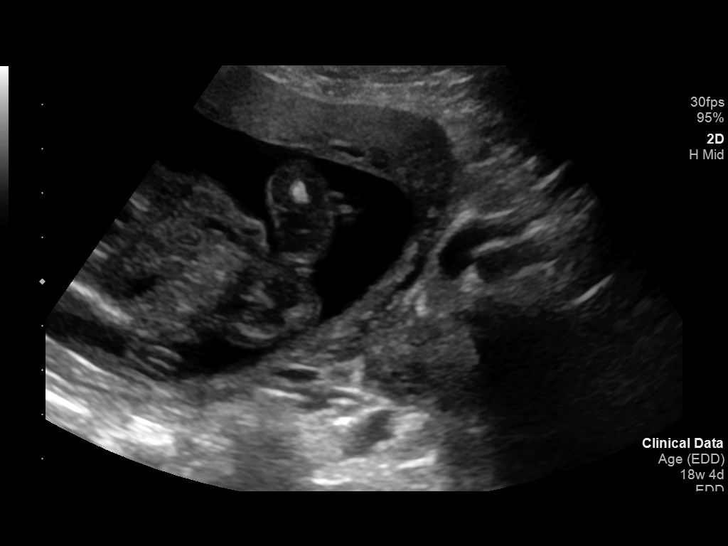
[im 43/58]
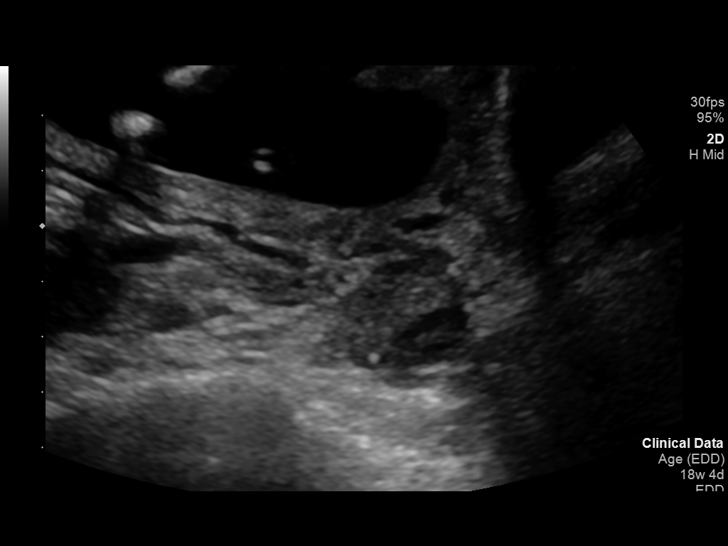
[im 48/58]
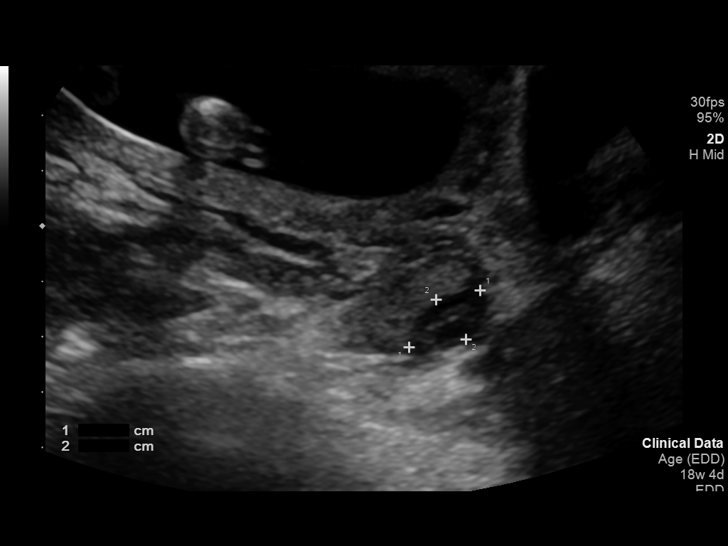
[im 53/58]
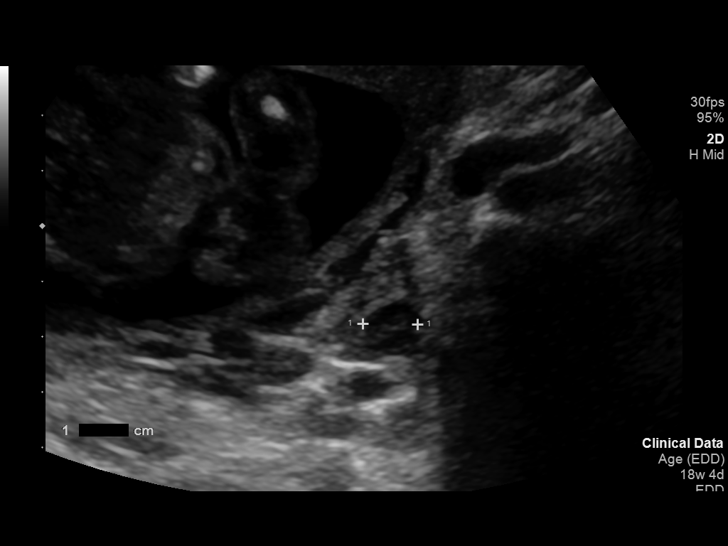
[im 58/58]
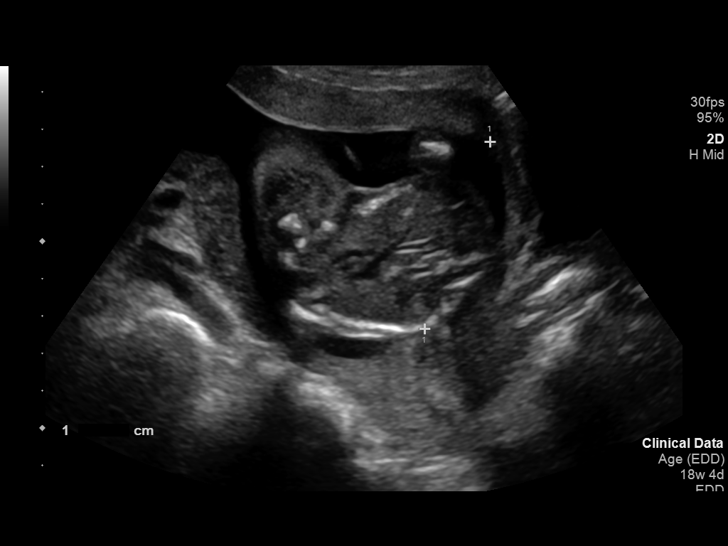

[13 of 25 positions shown; findings below may reference images not displayed]

FINDINGS: Number of Fetuses: 1

Heart Rate:  149 bpm

Movement: Detected

Presentation: Breech

Placental Location: Anterior

Previa: No

Amniotic Fluid (Subjective):  Within normal limits.

BPD:  4.2cm 18w 6d

MATERNAL FINDINGS:

Cervix:  Appears closed.

Uterus/Adnexae: No abnormality visualized.

Pulsed Doppler evaluation of both ovaries demonstrates normal
low-resistance arterial and venous waveforms.

Other findings

There is a band like structure along the posterior aspect of the
placenta which may represent fold but concerning for amniotic band.
Complete ultrasound with fetal anatomy survey is recommended on a
non emergent/outpatient basis.
IMPRESSION: 1. Single live intrauterine pregnancy with an estimated gestational
age of 18 weeks, 6 days based on biparietal diameter.
2. Band like structure posterior to the placenta concerning for
amniotic band. Dedicated second trimester ultrasound with fetal
anatomy scan is recommended on nonemergent/outpatient basis.
3. Unremarkable ovaries.

This exam is performed on an emergent basis and does not
comprehensively evaluate fetal size, dating, or anatomy; follow-up
complete OB US should be considered if further fetal assessment is
warranted.

## 2020-07-02 IMAGING — US US OB LIMITED
1 series · 13 of 28 positions shown · non-contrast
Comparison: none

CLINICAL DATA: 35-year-old pregnant female with right lower
quadrant abdominal pain. Concern for ovarian torsion. Provided
gestational age of 18 weeks, 4 days.

EXAM:
LIMITED OBSTETRIC ULTRASOUND AN DOPPLER

[Series 1: us ob limited · 58 acquisitions, 13 frames shown]
[im 3/58]
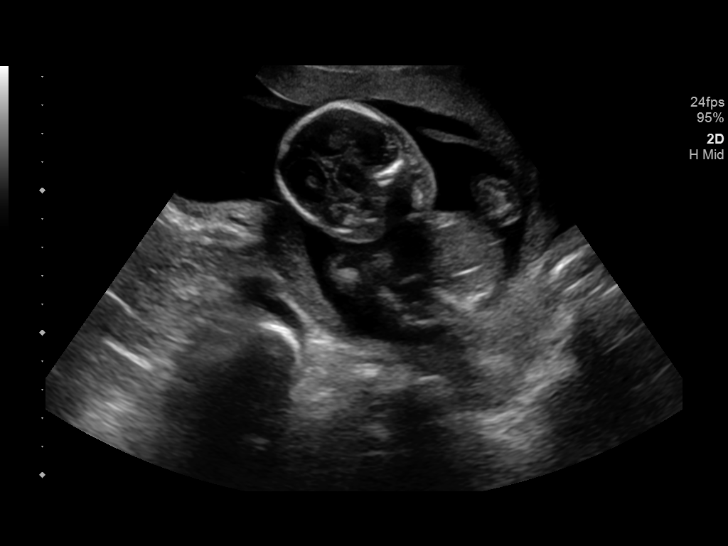
[im 7/58]
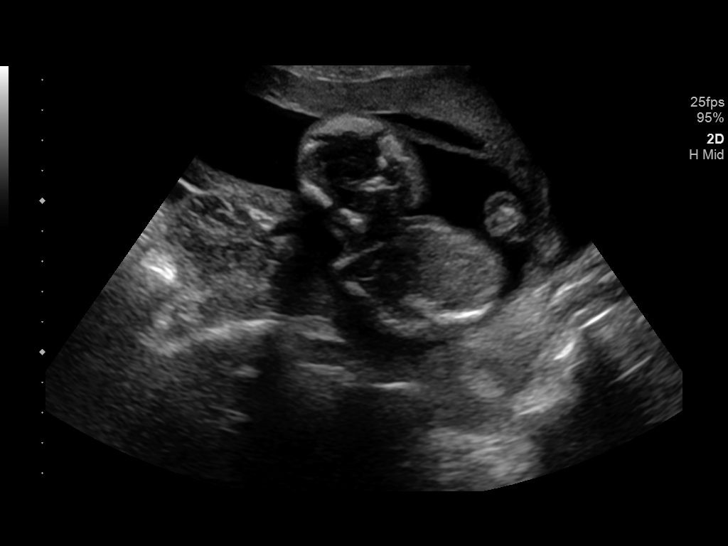
[im 11/58]
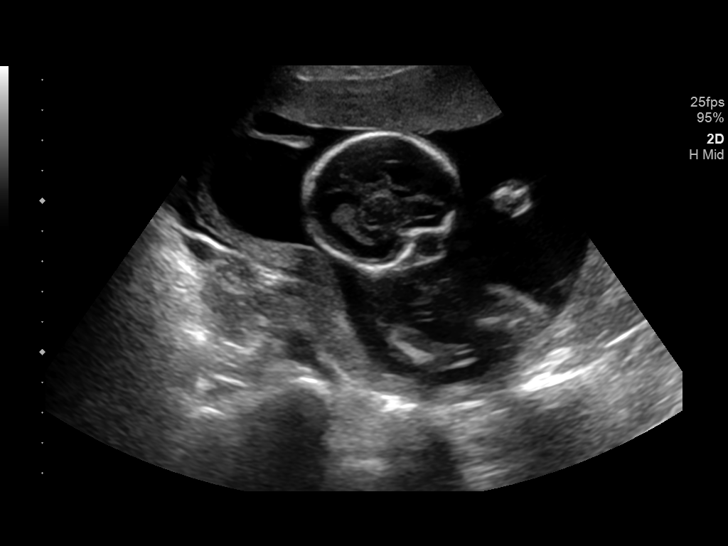
[im 15/58]
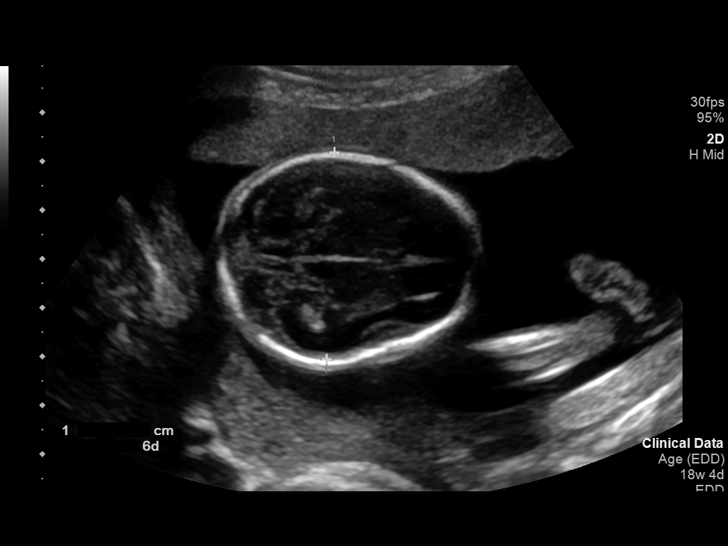
[im 20/58]
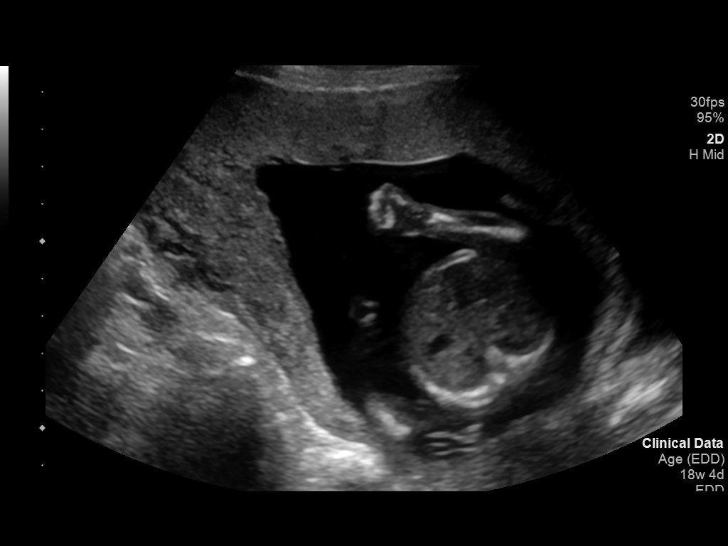
[im 24/58]
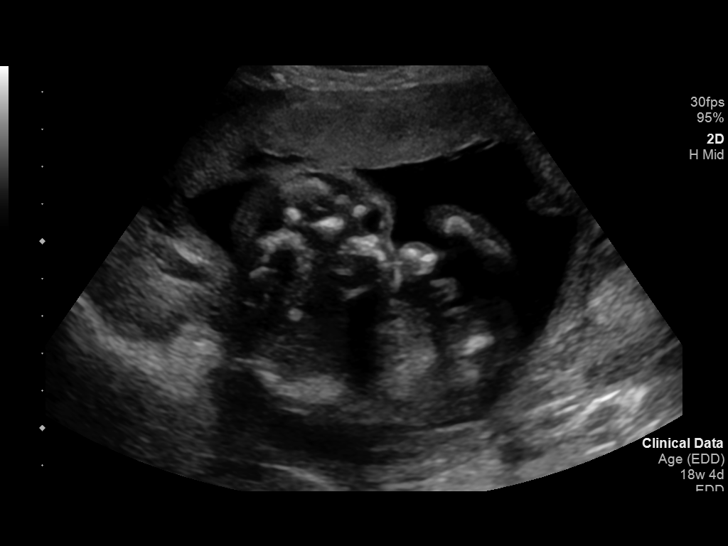
[im 30/58]
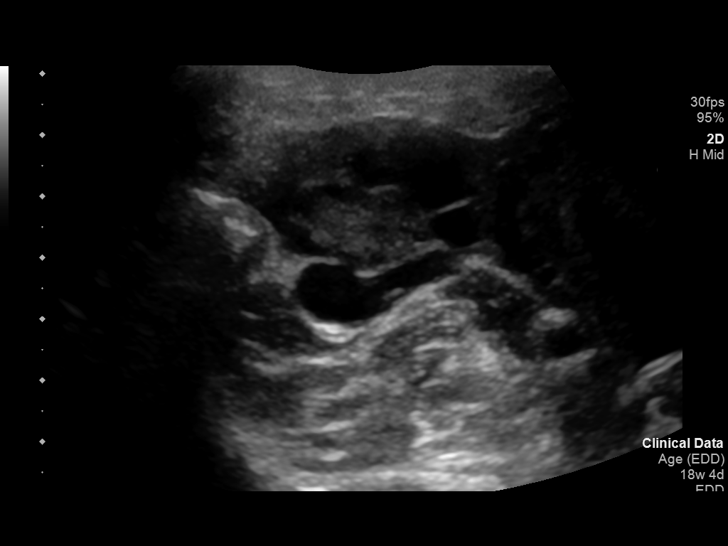
[im 34/58]
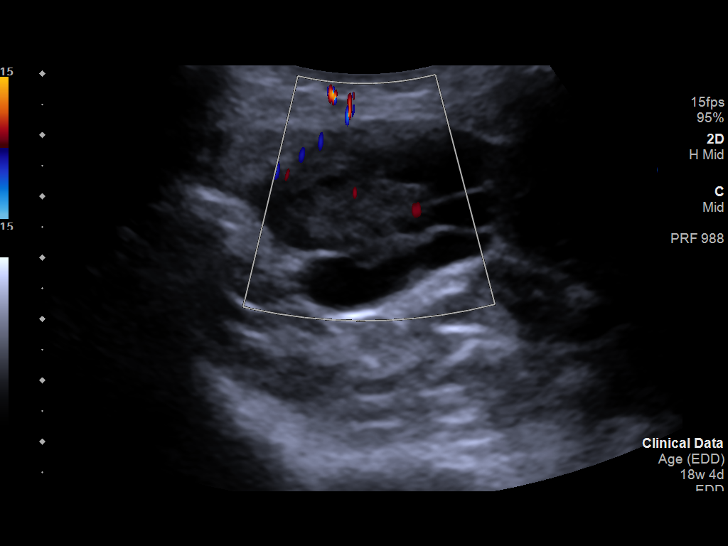
[im 39/58]
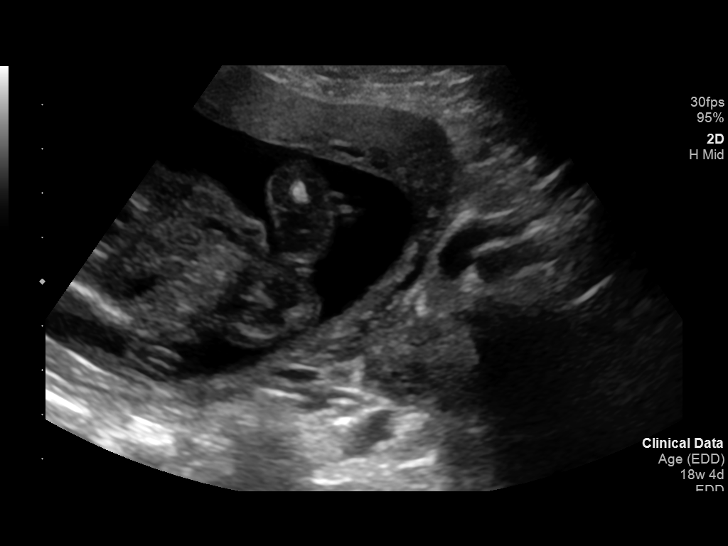
[im 43/58]
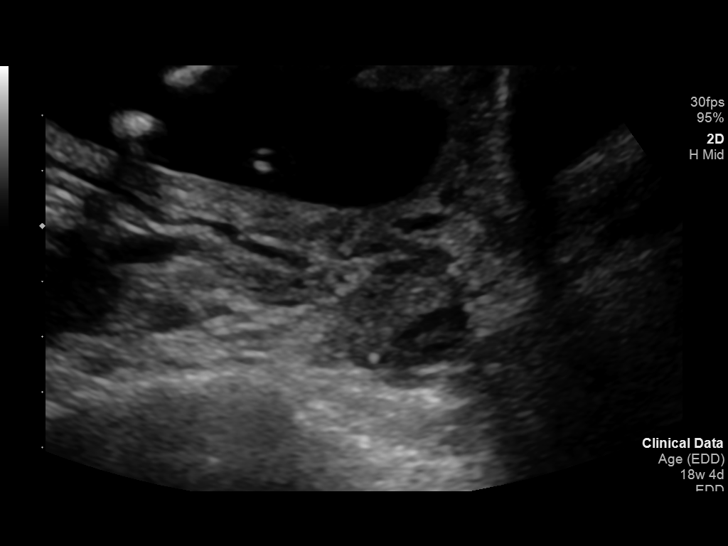
[im 47/58]
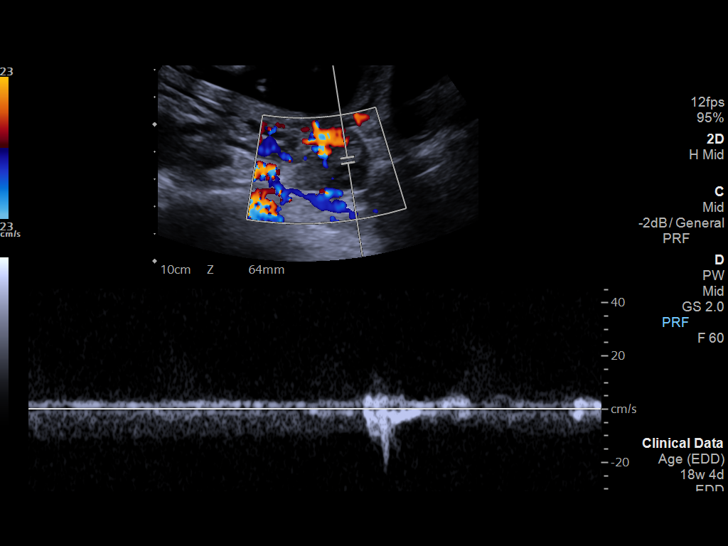
[im 51/58]
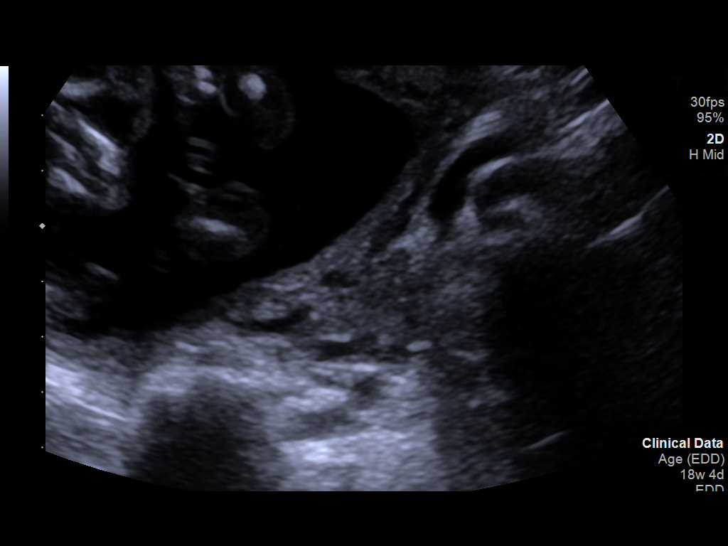
[im 55/58]
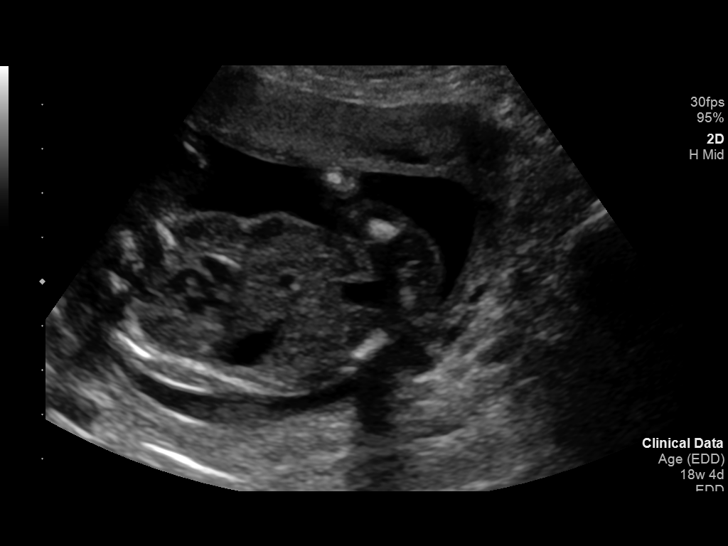

[13 of 28 positions shown; findings below may reference images not displayed]

FINDINGS: Number of Fetuses: 1

Heart Rate:  149 bpm

Movement: Detected

Presentation: Breech

Placental Location: Anterior

Previa: No

Amniotic Fluid (Subjective):  Within normal limits.

BPD:  4.2cm 18w 6d

MATERNAL FINDINGS:

Cervix:  Appears closed.

Uterus/Adnexae: No abnormality visualized.

Pulsed Doppler evaluation of both ovaries demonstrates normal
low-resistance arterial and venous waveforms.

Other findings

There is a band like structure along the posterior aspect of the
placenta which may represent fold but concerning for amniotic band.
Complete ultrasound with fetal anatomy survey is recommended on a
non emergent/outpatient basis.
IMPRESSION: 1. Single live intrauterine pregnancy with an estimated gestational
age of 18 weeks, 6 days based on biparietal diameter.
2. Band like structure posterior to the placenta concerning for
amniotic band. Dedicated second trimester ultrasound with fetal
anatomy scan is recommended on nonemergent/outpatient basis.
3. Unremarkable ovaries.

This exam is performed on an emergent basis and does not
comprehensively evaluate fetal size, dating, or anatomy; follow-up
complete OB US should be considered if further fetal assessment is
warranted.

## 2020-07-02 MED ORDER — SODIUM CHLORIDE 0.9 % IV BOLUS
1000.0000 mL | Freq: Once | INTRAVENOUS | Status: AC
  Administered 2020-07-02: 1000 mL via INTRAVENOUS

## 2020-07-02 NOTE — ED Triage Notes (Signed)
Pt reports a near syncopal episode this evening and then sharp R sided abd pain radiating to pelvis. Y1E5. Denies any hx pregnancy related complications. Had colposcopy on Tuesday   Sees Dr Timothy Lasso with [facility]

## 2020-07-02 NOTE — ED Provider Notes (Signed)
[facility]-EMERGENCY DEPT Provider Note   CSN: [id number] Arrival date & time: 07/02/20  2105     History Chief Complaint  Patient presents with  . Near Syncope  . Abdominal Pain    Susan Neal is a 35 y.o. female G60P3 - currently pregnant at [redacted] weeks presents to the Emergency Department complaining of acute onset near syncope around 8 PM.  Patient reports she was standing in her neighbor's house when she became lightheaded and developed tunnel vision.  She reports she did not fully pass out.  Several minutes after this she developed acute, severe and persistent right lower quadrant abdominal pain.  Patient reports she has been eating and drinking normally without significant nausea or vomiting.  She reports she has previously had round ligament pain but this feels very different.  She states that she last felt the baby move around 3 PM but has not felt the baby since that time.  She does not know if she has had any vaginal bleeding.  She denies all urinary symptoms.  Patient reports 1 ultrasound early in the pregnancy that confirmed IUP but has not had a second ultrasound.  Patient saw her OB/GYN on Tuesday with normal checkup and colposcopy.  She reports some minor cramping and spotting that day but no symptoms Wednesday or Thursday.  No known aggravating or alleviating factors.  Patient reports no additional near syncopal episodes but the abdominal pain persists.  Movement and palpation makes the symptoms worse.  OB/GYN: Dr. Timothy Lasso with [facility]   The history is provided by the patient, a significant other and medical records. No language interpreter was used.       Past Medical History:  Diagnosis Date  . Anxiety     There are no problems to display for this patient.   Past Surgical History:  Procedure Laterality Date  . leap       OB History    Gravida  1   Para      Term      Preterm      AB      Living        SAB       IAB      Ectopic      Multiple      Live Births              History reviewed. No pertinent family history.  Social History   Tobacco Use  . Smoking status: Never Smoker  . Smokeless tobacco: Never Used  Substance Use Topics  . Alcohol use: Yes  . Drug use: Never    Home Medications Prior to Admission medications   Medication Sig Start Date End Date Taking? Authorizing Provider  omeprazole (PRILOSEC) 20 MG capsule Take 1 capsule (20 mg total) by mouth daily. Patient not taking: Reported on 07/02/2020 12/25/19   Arby Barrette, MD    Allergies    Tramadol  Review of Systems   Review of Systems  Constitutional: Negative for appetite change, diaphoresis, fatigue, fever and unexpected weight change.  HENT: Negative for mouth sores.   Eyes: Negative for visual disturbance.  Respiratory: Negative for cough, chest tightness, shortness of breath and wheezing.   Cardiovascular: Negative for chest pain.  Gastrointestinal: Positive for abdominal pain. Negative for constipation, diarrhea, nausea and vomiting.  Endocrine: Negative for polydipsia, polyphagia and polyuria.  Genitourinary: Negative for dysuria, frequency, hematuria and urgency.  Musculoskeletal: Negative for back pain and neck stiffness.  Skin: Negative for rash.  Allergic/Immunologic: Negative for immunocompromised state.  Neurological: Positive for syncope (near syncope). Negative for light-headedness and headaches.  Hematological: Does not bruise/bleed easily.  Psychiatric/Behavioral: Negative for sleep disturbance. The patient is nervous/anxious.     Physical Exam Updated Vital Signs BP 102/70   Pulse 76   Temp 98.2 F (36.8 C) (Oral)   Resp 16   Ht 5\' 4"  (1.626 m)   Wt 47.4 kg   SpO2 98%   BMI 17.92 kg/m   Physical Exam Vitals and nursing note reviewed.  Constitutional:      General: She is not in acute distress.    Appearance: She is not diaphoretic.  HENT:     Head: Normocephalic.  Eyes:      General: No scleral icterus.    Conjunctiva/sclera: Conjunctivae normal.  Cardiovascular:     Rate and Rhythm: Regular rhythm. Tachycardia present.     Pulses: Normal pulses.          Radial pulses are 2+ on the right side and 2+ on the left side.  Pulmonary:     Effort: No tachypnea, accessory muscle usage, prolonged expiration, respiratory distress or retractions.     Breath sounds: No stridor.     Comments: Equal chest rise. No increased work of breathing. Abdominal:     General: Bowel sounds are normal. There is no distension.     Palpations: Abdomen is soft.     Tenderness: There is abdominal tenderness in the right lower quadrant, suprapubic area and left lower quadrant. There is guarding ( RLQ only). There is no right CVA tenderness, left CVA tenderness or rebound.     Hernia: A hernia is present.     Comments: Mild tenderness in the left lower quadrant and suprapubic region.  Exquisite tenderness to the right lower quadrant with guarding.  Musculoskeletal:     Cervical back: Normal range of motion.     Comments: Moves all extremities equally and without difficulty.  Skin:    General: Skin is warm and dry.     Capillary Refill: Capillary refill takes less than 2 seconds.  Neurological:     General: No focal deficit present.     Mental Status: She is alert.     GCS: GCS eye subscore is 4. GCS verbal subscore is 5. GCS motor subscore is 6.     Comments: Speech is clear and goal oriented.  Psychiatric:        Mood and Affect: Mood is anxious ( very anxious and tearful).     ED Results / Procedures / Treatments   Labs (all labs ordered are listed, but only abnormal results are displayed) Labs Reviewed  URINALYSIS, ROUTINE W REFLEX MICROSCOPIC - Abnormal; Notable for the following components:      Result Value   Hgb urine dipstick MODERATE (*)    All other components within normal limits  COMPREHENSIVE METABOLIC PANEL - Abnormal; Notable for the following components:    Sodium 133 (*)    CO2 20 (*)    Glucose, Bld 105 (*)    All other components within normal limits  CBC WITH DIFFERENTIAL/PLATELET - Abnormal; Notable for the following components:   WBC 11.0 (*)    RBC 3.48 (*)    Hemoglobin 11.4 (*)    HCT 33.0 (*)    Neutro Abs 8.6 (*)    All other components within normal limits  CBG MONITORING, ED    EKG EKG Interpretation  Date/Time:  Thursday July 02 2020 21:27:56 EDT Ventricular Rate:  79 PR Interval:    QRS Duration: 81 QT Interval:  393 QTC Calculation: 451 R Axis:   82 Text Interpretation: Sinus rhythm Borderline repolarization abnormality Confirmed by Geoffery Lyons ([id number]) on 07/03/2020 1:29:50 AM     Radiology US OB Limited > 14 wks  Result Date: 07/02/2020 CLINICAL DATA:  35 year old pregnant female with right lower quadrant abdominal pain. Concern for ovarian torsion. Provided gestational age of [redacted] weeks, 4 days. EXAM: LIMITED OBSTETRIC ULTRASOUND AN DOPPLER FINDINGS: Number of Fetuses: 1 Heart Rate:  149 bpm Movement: Detected Presentation: Breech Placental Location: Anterior Previa: No Amniotic Fluid (Subjective):  Within normal limits. BPD:  4.2cm 18w 6d MATERNAL FINDINGS: Cervix:  Appears closed. Uterus/Adnexae: No abnormality visualized. Pulsed Doppler evaluation of both ovaries demonstrates normal low-resistance arterial and venous waveforms. Other findings There is a band like structure along the posterior aspect of the placenta which may represent fold but concerning for amniotic band. Complete ultrasound with fetal anatomy survey is recommended on a non emergent/outpatient basis. IMPRESSION: 1. Single live intrauterine pregnancy with an estimated gestational age of [redacted] weeks, 6 days based on biparietal diameter. 2. Band like structure posterior to the placenta concerning for amniotic band. Dedicated second trimester ultrasound with fetal anatomy scan is recommended on nonemergent/outpatient basis. 3. Unremarkable ovaries. This  exam is performed on an emergent basis and does not comprehensively evaluate fetal size, dating, or anatomy; follow-up complete OB US should be considered if further fetal assessment is warranted. Electronically Signed   By: Elgie Collard M.D.   On: 07/02/2020 23:35   US PELVIC DOPPLER (TORSION R/O OR MASS ARTERIAL FLOW)  Result Date: 07/02/2020 CLINICAL DATA:  35 year old pregnant female with right lower quadrant abdominal pain. Concern for ovarian torsion. Provided gestational age of [redacted] weeks, 4 days. EXAM: LIMITED OBSTETRIC ULTRASOUND AN DOPPLER FINDINGS: Number of Fetuses: 1 Heart Rate:  149 bpm Movement: Detected Presentation: Breech Placental Location: Anterior Previa: No Amniotic Fluid (Subjective):  Within normal limits. BPD:  4.2cm 18w 6d MATERNAL FINDINGS: Cervix:  Appears closed. Uterus/Adnexae: No abnormality visualized. Pulsed Doppler evaluation of both ovaries demonstrates normal low-resistance arterial and venous waveforms. Other findings There is a band like structure along the posterior aspect of the placenta which may represent fold but concerning for amniotic band. Complete ultrasound with fetal anatomy survey is recommended on a non emergent/outpatient basis. IMPRESSION: 1. Single live intrauterine pregnancy with an estimated gestational age of [redacted] weeks, 6 days based on biparietal diameter. 2. Band like structure posterior to the placenta concerning for amniotic band. Dedicated second trimester ultrasound with fetal anatomy scan is recommended on nonemergent/outpatient basis. 3. Unremarkable ovaries. This exam is performed on an emergent basis and does not comprehensively evaluate fetal size, dating, or anatomy; follow-up complete OB US should be considered if further fetal assessment is warranted. Electronically Signed   By: Elgie Collard M.D.   On: 07/02/2020 23:35    Procedures Procedures   Medications Ordered in ED Medications  sodium chloride 0.9 % bolus 1,000 mL (0 mLs  Intravenous Stopped 07/03/20 0011)    ED Course  I have reviewed the triage vital signs and the nursing notes.  Pertinent labs & imaging results that were available during my care of the patient were reviewed by me and considered in my medical decision making (see chart for details).    MDM Rules/Calculators/A&P  Patient presents with right lower quadrant abdominal pain and near syncope.  Patient is currently pregnant at [redacted] weeks.  Right-sided abdominal pain is severe and persistent.  Considering heterotopic/ectopic pregnancy, ovarian torsion, ruptured ovarian cyst, appendicitis, placental abruption.  Additional history obtained:  Additional history obtained from  patient's fianc at bedside. Previous records obtained and reviewed.    Lab Tests:  I Ordered, reviewed, and interpreted labs, which included:  Basic labs show leukocytosis and mild anemia but are otherwise reassuring.  ECG:  I personally viewed and interpreted the ECG obtained. NSR without ischemia.   Imaging Studies ordered:  I have placed order for imaging including abdominal and transvaginal ultrasound.  No evidence of ovarian torsion or placental abruption.  Live IUP.  No ovarian cyst to cause pain.  Amniotic band is noted and patient will need close follow-up with this on an outpatient basis.   ED Course:   1:16 AM She continues to have moderate to severe pain.  I remain concerned about appendicitis.  Discussed with Dr. Kaylyn Layer, OB/GYN who recommends ED to ED transfer to [facility] for MRI of the abdomen.  She has evaluated labs and imaging with me and does not feel like this is pregnancy related at this time.  1:24 AM  Will ED to ED transfer for Abdominal MRI - Dr. Jacqulyn Bath at [facility] accepts in transfer for ED-ED.   Patient voiced understanding and agreement wit the current medical evaluation and treatment plan.  Questions were answered to expressed satisfaction.     Portions of this note were  generated with Scientist, clinical (histocompatibility and immunogenetics). Dictation errors may occur despite best attempts at proofreading.   Final Clinical Impression(s) / ED Diagnoses Final diagnoses:  RLQ abdominal pain  [redacted] weeks gestation of pregnancy    Rx / DC Orders ED Discharge Orders    None       Tecora Eustache, Boyd Kerbs 07/03/20 0602    Geoffery Lyons, MD 07/03/20 [phone]

## 2020-07-03 ENCOUNTER — Emergency Department (HOSPITAL_COMMUNITY): Payer: Medicaid Other

## 2020-07-03 ENCOUNTER — Other Ambulatory Visit: Payer: Self-pay

## 2020-07-03 ENCOUNTER — Encounter (HOSPITAL_COMMUNITY): Payer: Self-pay

## 2020-07-03 DIAGNOSIS — O26892 Other specified pregnancy related conditions, second trimester: Secondary | ICD-10-CM | POA: Diagnosis present

## 2020-07-03 DIAGNOSIS — K469 Unspecified abdominal hernia without obstruction or gangrene: Secondary | ICD-10-CM | POA: Diagnosis not present

## 2020-07-03 DIAGNOSIS — R000 Tachycardia, unspecified: Secondary | ICD-10-CM | POA: Diagnosis not present

## 2020-07-03 DIAGNOSIS — R55 Syncope and collapse: Secondary | ICD-10-CM | POA: Diagnosis not present

## 2020-07-03 DIAGNOSIS — Z3A18 18 weeks gestation of pregnancy: Secondary | ICD-10-CM | POA: Diagnosis not present

## 2020-07-03 LAB — URINALYSIS, ROUTINE W REFLEX MICROSCOPIC
Bacteria, UA: NONE SEEN
Bilirubin Urine: NEGATIVE
Glucose, UA: NEGATIVE mg/dL
Ketones, ur: NEGATIVE mg/dL
Leukocytes,Ua: NEGATIVE
Nitrite: NEGATIVE
Protein, ur: NEGATIVE mg/dL
Specific Gravity, Urine: 1.014 (ref 1.005–1.030)
pH: 6 (ref 5.0–8.0)

## 2020-07-03 IMAGING — MR MR PELVIS W/O CM
5 of 8 series · 19 of 48 positions shown · non-contrast
Comparison: None.

CLINICAL DATA: Right lower quadrant pain. Second trimester
pregnancy. Clinical suspicion for appendicitis.

EXAM:
MRI ABDOMEN AND PELVIS WITHOUT CONTRAST
TECHNIQUE: Multiplanar multisequence MR imaging of the abdomen and pelvis was
performed. No intravenous contrast was administered.

[Series 4: T2 fat-sat · axial · 5.0mm · 0.78mm/px · z∈[-23,+151]mm · 5 of 30 slices shown (1 of 2)]
[im 1/30]
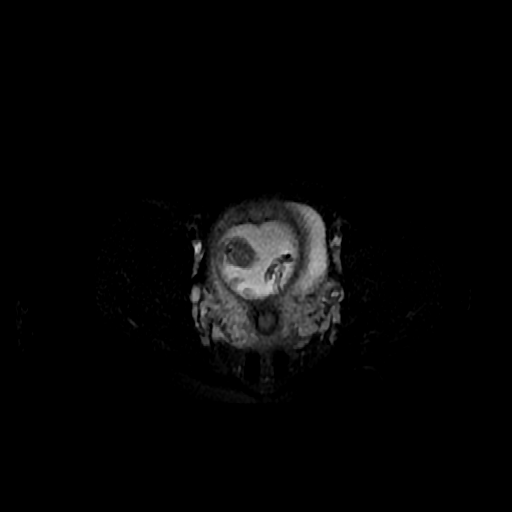
[im 8/30]
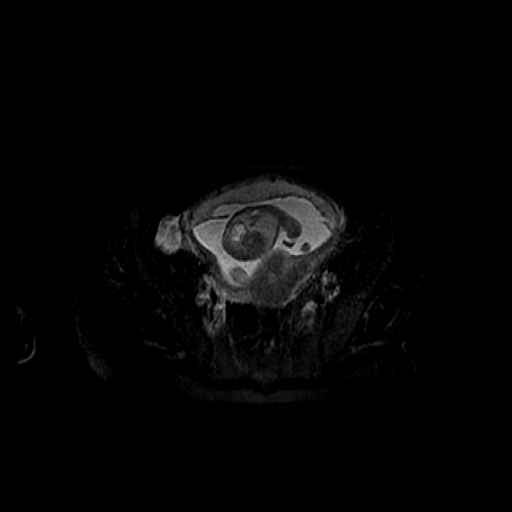
[im 15/30]
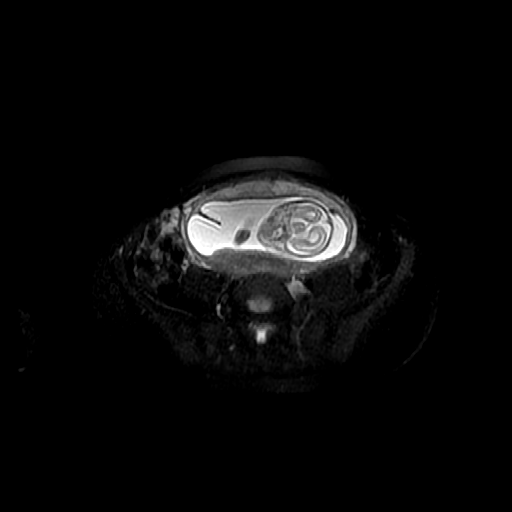
[im 22/30]
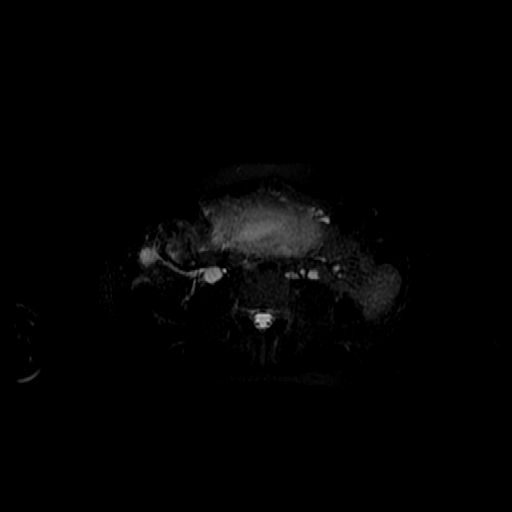
[im 30/30]
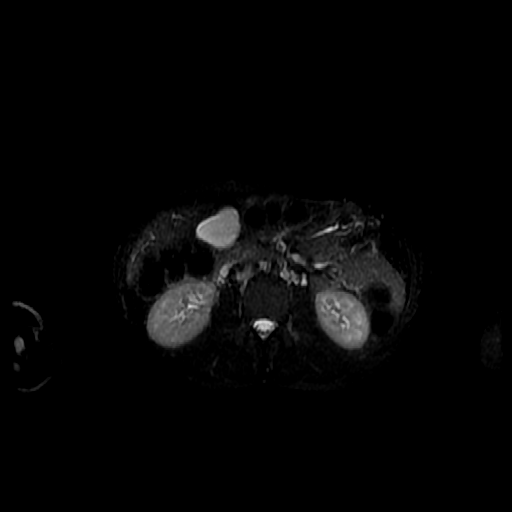

[Series 5: T2 fat-sat · axial · 5.0mm · 0.78mm/px · z∈[-23,+151]mm · 5 of 30 slices shown (2 of 2)]
[im 1/30]
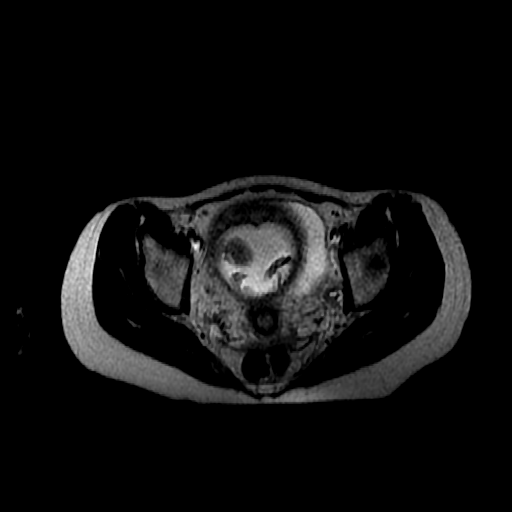
[im 8/30]
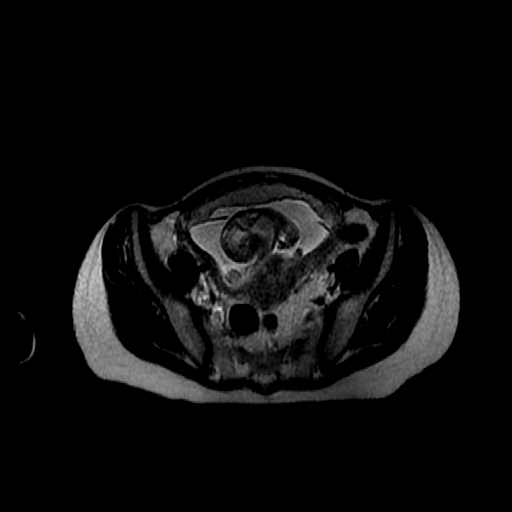
[im 15/30]
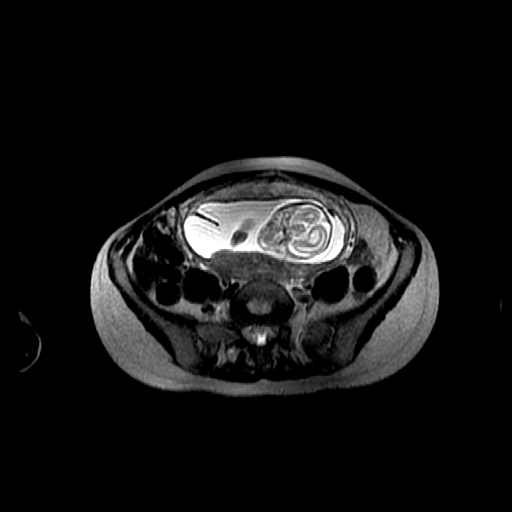
[im 22/30]
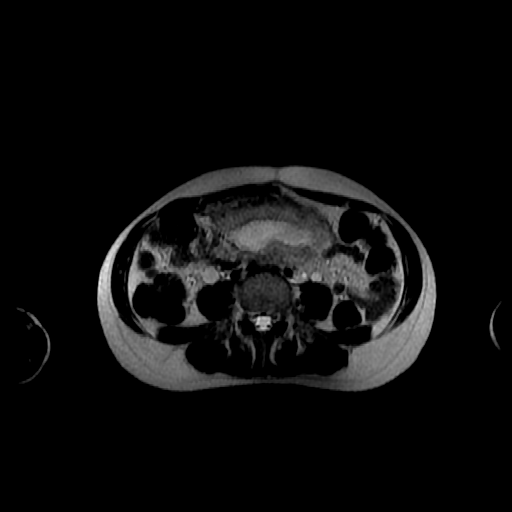
[im 30/30]
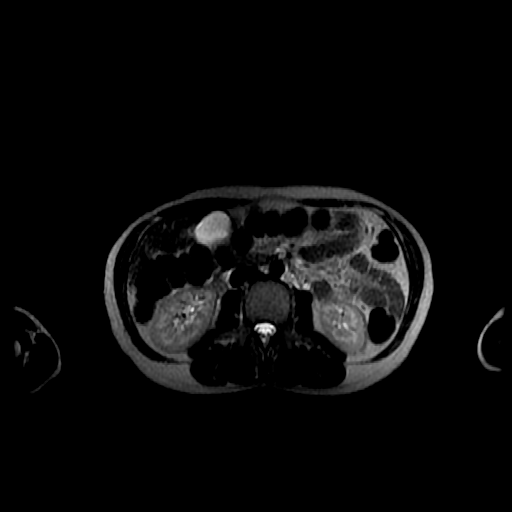

[Series 6: T2 · coronal · 5.0mm · 0.80mm/px · 4 of 24 slices shown (1 of 2)]
[im 1/24]
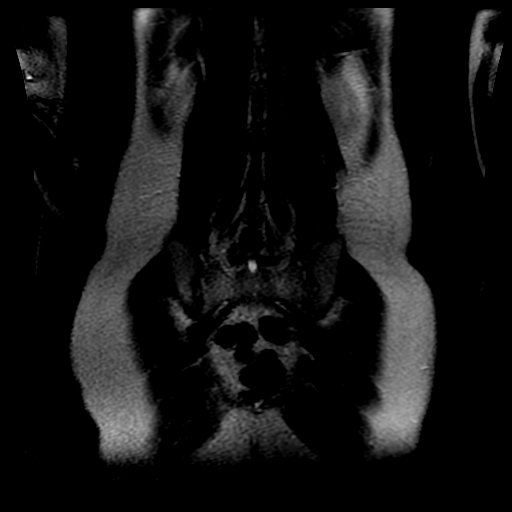
[im 8/24]
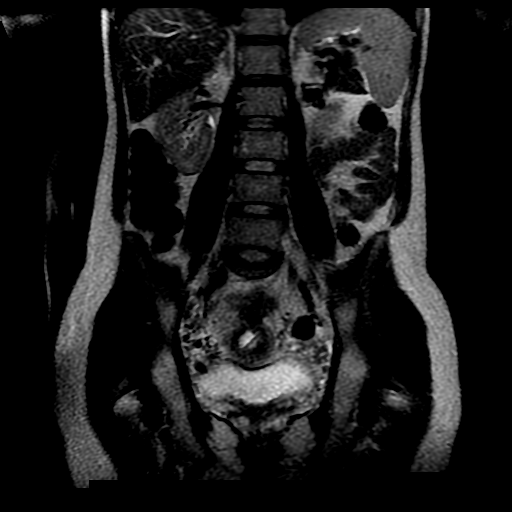
[im 16/24]
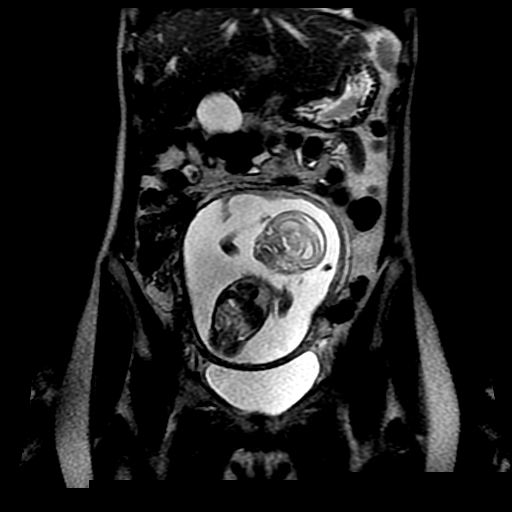
[im 24/24]
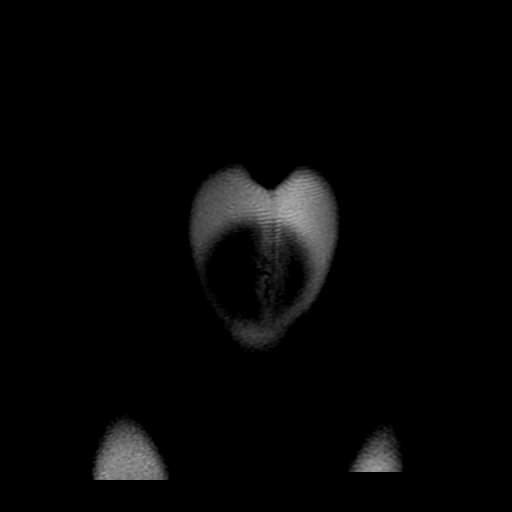

[Series 7: T2 · coronal · 5.0mm · 0.80mm/px · 4 of 24 slices shown (2 of 2)]
[im 1/24]
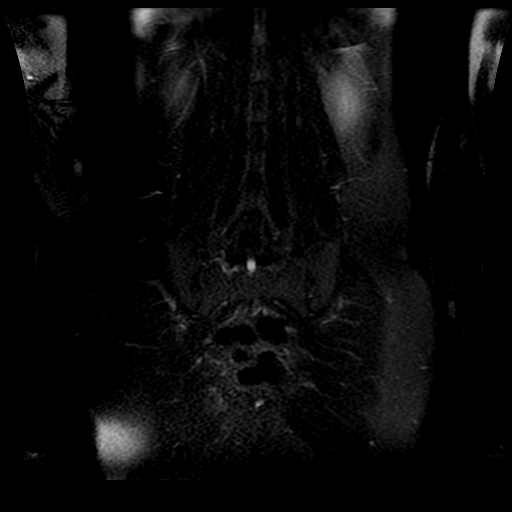
[im 8/24]
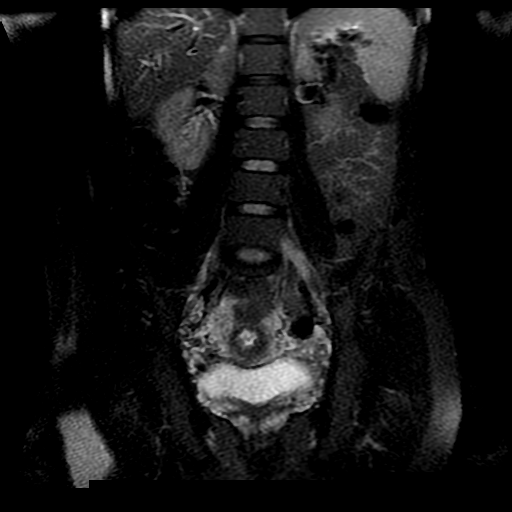
[im 16/24]
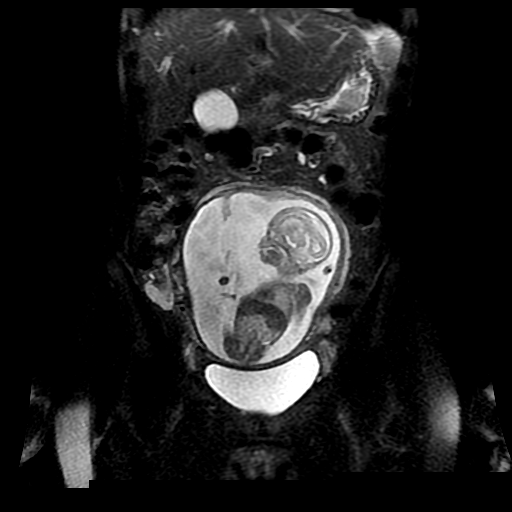
[im 24/24]
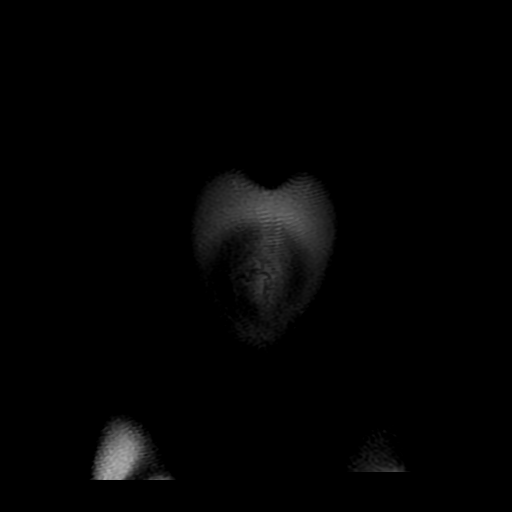

[Series 8: bSSFP · axial · 5.0mm · 0.78mm/px · 1 of 30 slices shown]
[im 1/30]
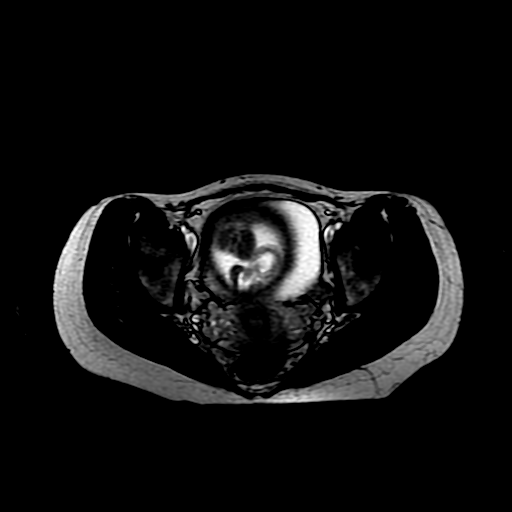

[19 of 48 positions shown; findings below may reference images not displayed]

FINDINGS: COMBINED FINDINGS FOR BOTH MR ABDOMEN AND PELVIS

Lower chest: No acute findings.

Hepatobiliary: No mass visualized on this unenhanced exam.
Gallbladder is unremarkable. No evidence of biliary ductal
dilatation.

Pancreas: No mass or inflammatory process visualized on this
unenhanced exam.

Spleen:  Within normal limits in size.

Adrenals/Urinary tract: Unremarkable. No evidence of mass or
hydronephrosis.

Stomach/Bowel: Although the appendix is not directly visualized, no
inflammatory process seen in region of the cecum or elsewhere. No
evidence of dilated bowel loops, bowel wall thickening, or abnormal
fluid collections.

Vascular/Lymphatic: No pathologically enlarged lymph nodes
identified. No evidence of abdominal aortic aneurysm.

Reproductive: A single intrauterine fetus is seen in breech
presentation. No evidence of pelvic mass, inflammatory process, or
abnormal fluid collections.

Other:  None.

Musculoskeletal:  No suspicious bone lesions identified.
IMPRESSION: Single intrauterine fetus in breech presentation.

No evidence of appendicitis, hydronephrosis, or other acute
findings.

## 2020-07-03 IMAGING — MR MR ABDOMEN W/O CM
4 of 6 series · 19 of 48 positions shown · non-contrast
Comparison: None.

CLINICAL DATA: Right lower quadrant pain. Second trimester
pregnancy. Clinical suspicion for appendicitis.

EXAM:
MRI ABDOMEN AND PELVIS WITHOUT CONTRAST
TECHNIQUE: Multiplanar multisequence MR imaging of the abdomen and pelvis was
performed. No intravenous contrast was administered.

[Series 4: bSSFP · axial · 6.0mm · 0.86mm/px · z∈[-258,+106]mm · 8 of 53 slices shown]
[im 1/53]
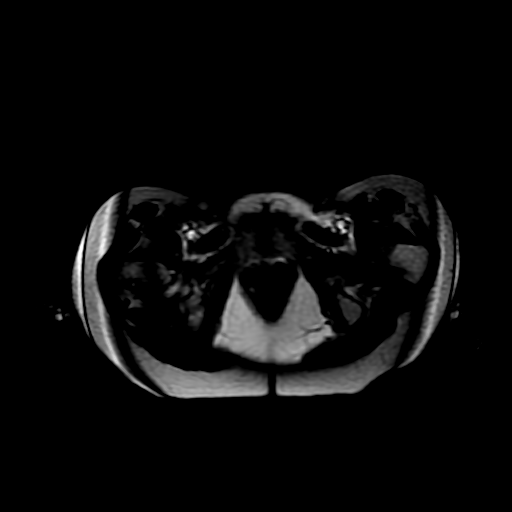
[im 8/53]
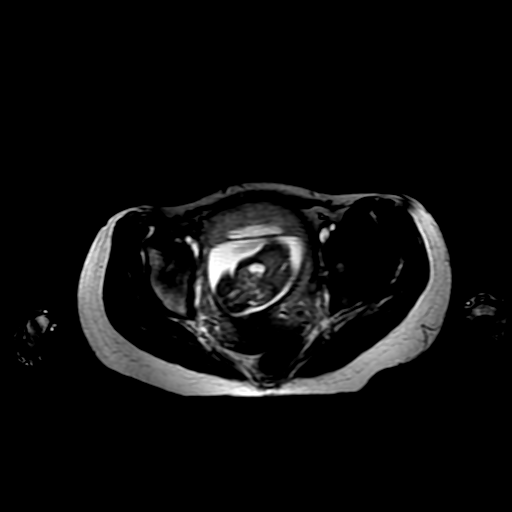
[im 15/53]
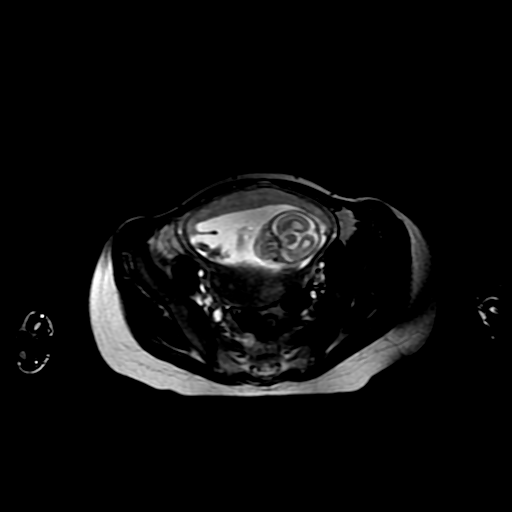
[im 23/53]
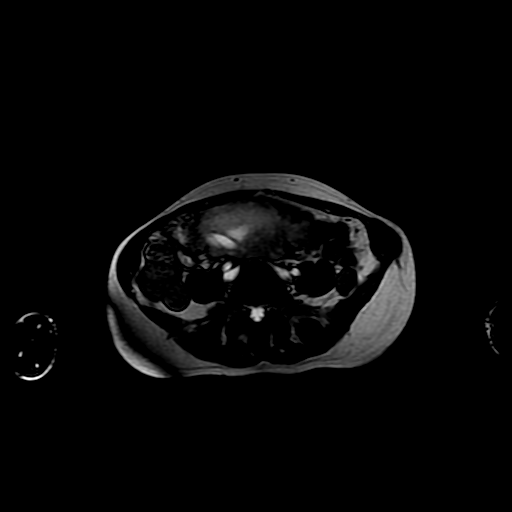
[im 30/53]
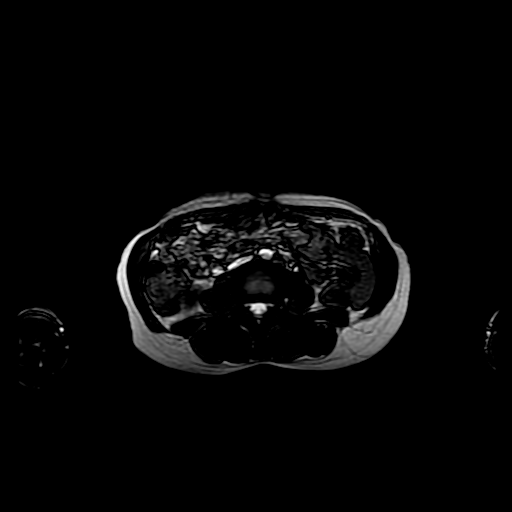
[im 38/53]
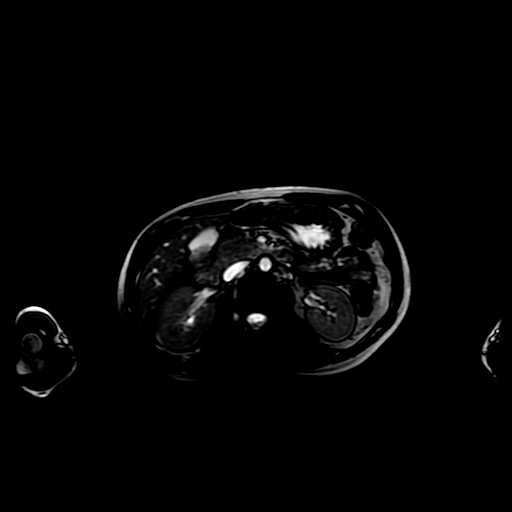
[im 45/53]
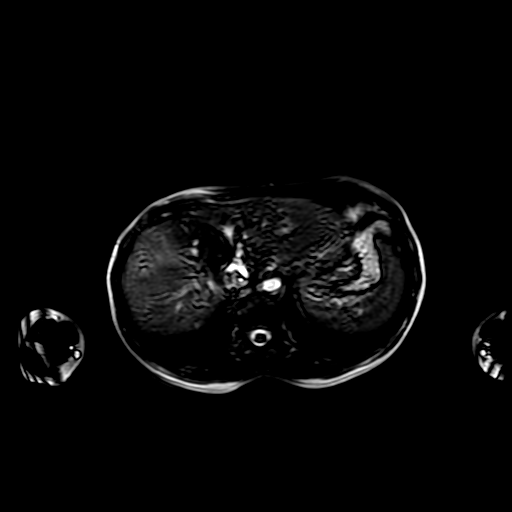
[im 53/53]
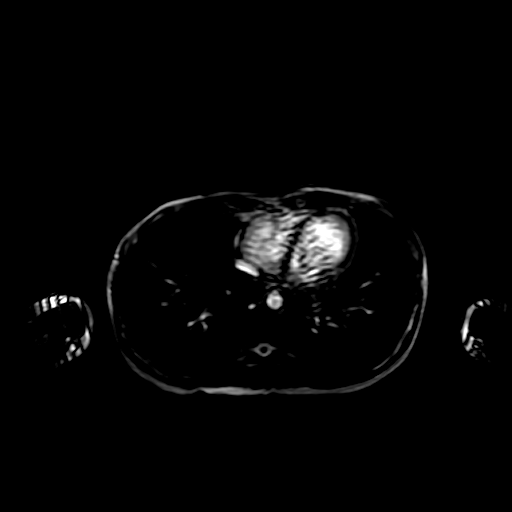

[Series 5: T2 · coronal · 10.0mm · 0.86mm/px · 3 of 21 slices shown]
[im 1/21]
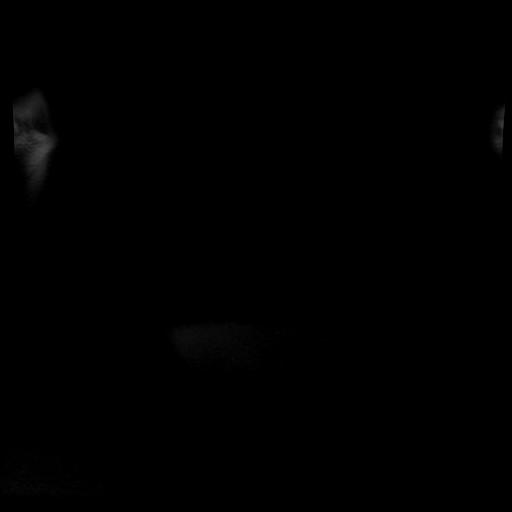
[im 11/21]
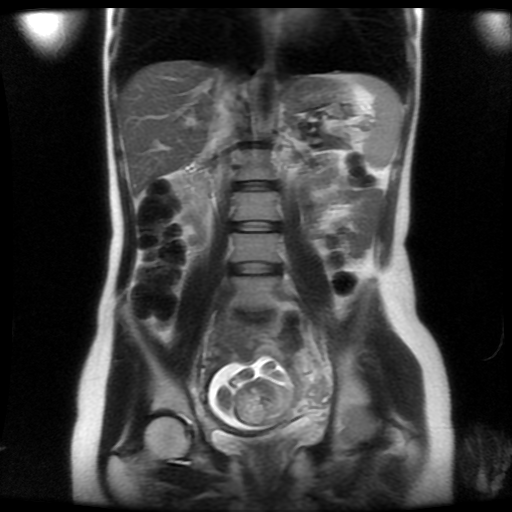
[im 21/21]
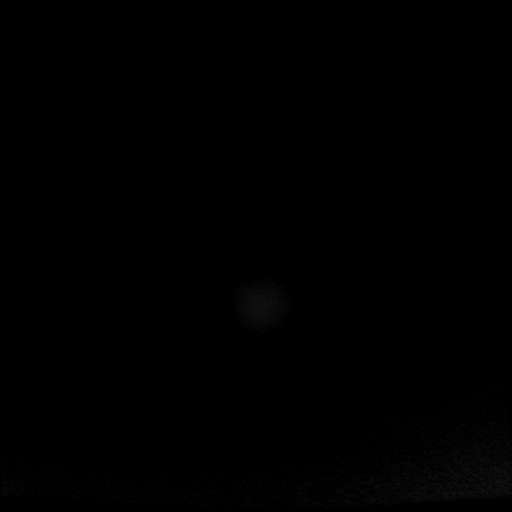

[Series 6: T2 fat-sat · coronal · 10.0mm · 0.86mm/px · 3 of 21 slices shown (1 of 2)]
[im 1/21]
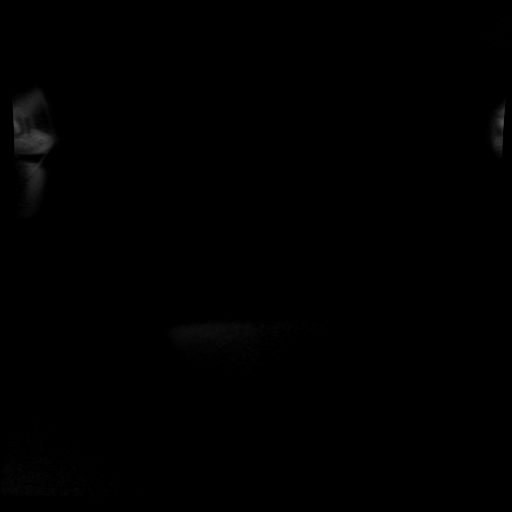
[im 11/21]
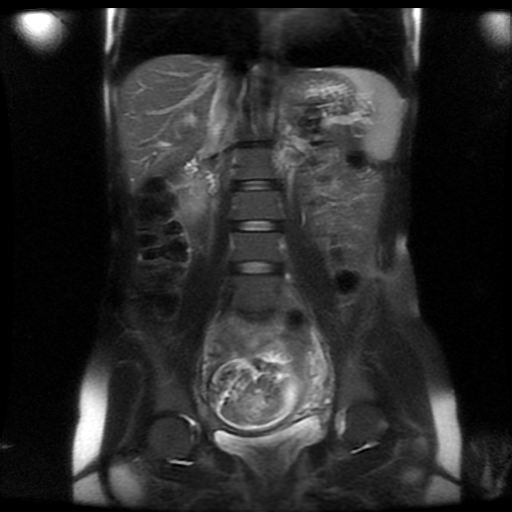
[im 21/21]
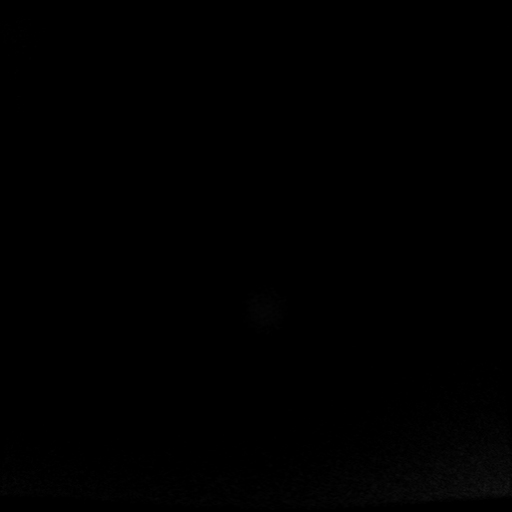

[Series 7: T2 fat-sat · axial · 10.0mm · 0.78mm/px · z∈[-278,+62]mm · 5 of 42 slices shown (2 of 2)]
[im 1/42]
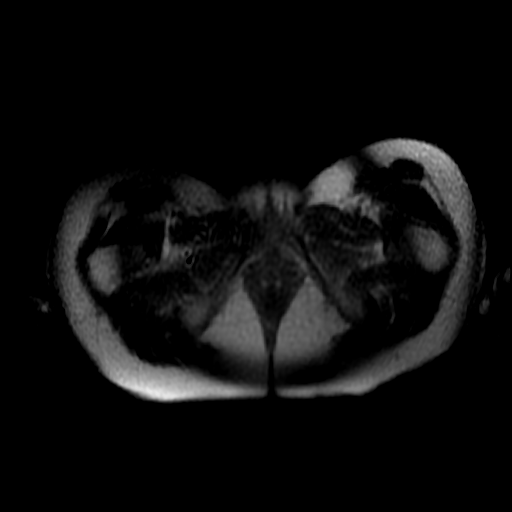
[im 7/42]
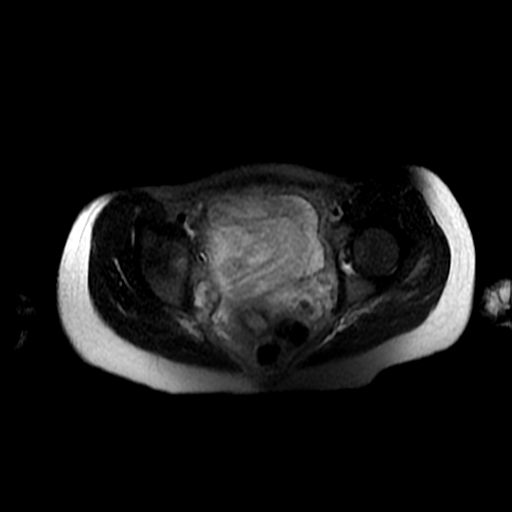
[im 14/42]
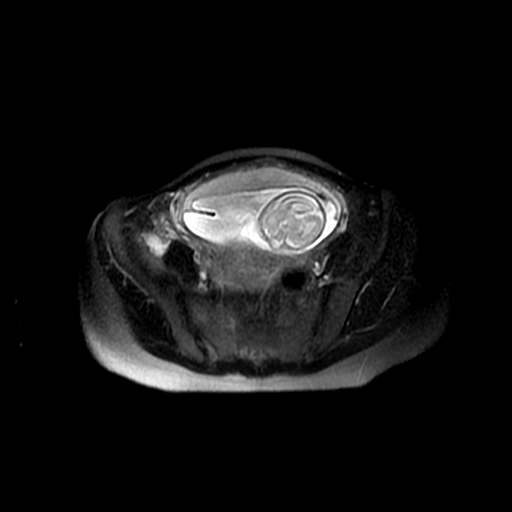
[im 21/42]
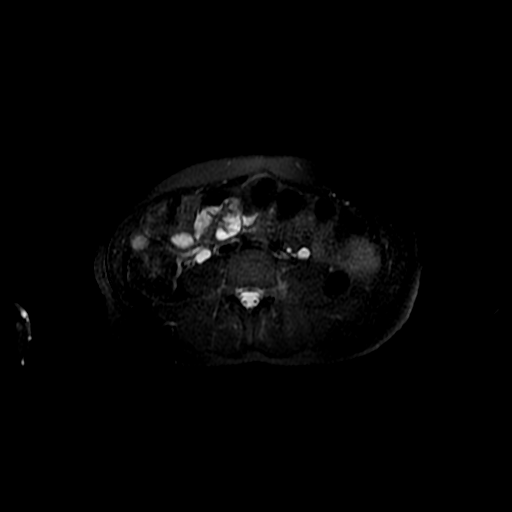
[im 35/42]
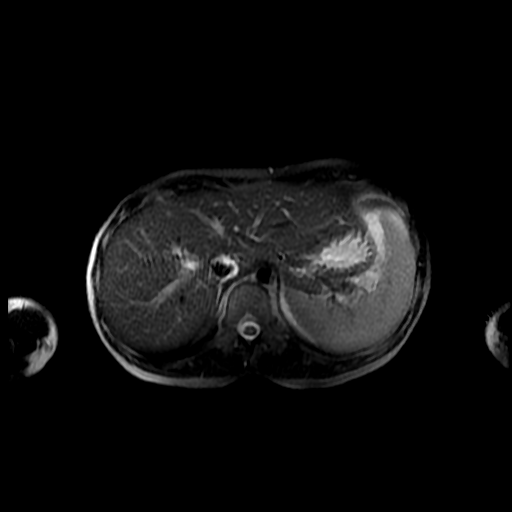

[19 of 48 positions shown; findings below may reference images not displayed]

FINDINGS: COMBINED FINDINGS FOR BOTH MR ABDOMEN AND PELVIS

Lower chest: No acute findings.

Hepatobiliary: No mass visualized on this unenhanced exam.
Gallbladder is unremarkable. No evidence of biliary ductal
dilatation.

Pancreas: No mass or inflammatory process visualized on this
unenhanced exam.

Spleen:  Within normal limits in size.

Adrenals/Urinary tract: Unremarkable. No evidence of mass or
hydronephrosis.

Stomach/Bowel: Although the appendix is not directly visualized, no
inflammatory process seen in region of the cecum or elsewhere. No
evidence of dilated bowel loops, bowel wall thickening, or abnormal
fluid collections.

Vascular/Lymphatic: No pathologically enlarged lymph nodes
identified. No evidence of abdominal aortic aneurysm.

Reproductive: A single intrauterine fetus is seen in breech
presentation. No evidence of pelvic mass, inflammatory process, or
abnormal fluid collections.

Other:  None.

Musculoskeletal:  No suspicious bone lesions identified.
IMPRESSION: Single intrauterine fetus in breech presentation.

No evidence of appendicitis, hydronephrosis, or other acute
findings.

## 2020-07-03 IMAGING — MR MR PELVIS W/O CM
4 of 6 series · 19 of 48 positions shown · non-contrast
Comparison: None.

CLINICAL DATA: Right lower quadrant pain. Second trimester
pregnancy. Clinical suspicion for appendicitis.

EXAM:
MRI ABDOMEN AND PELVIS WITHOUT CONTRAST
TECHNIQUE: Multiplanar multisequence MR imaging of the abdomen and pelvis was
performed. No intravenous contrast was administered.

[Series 4: bSSFP · axial · 6.0mm · 0.86mm/px · z∈[-258,+106]mm · 8 of 53 slices shown]
[im 1/53]
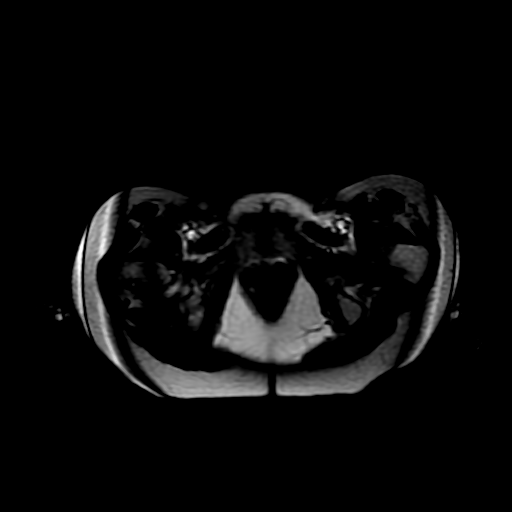
[im 8/53]
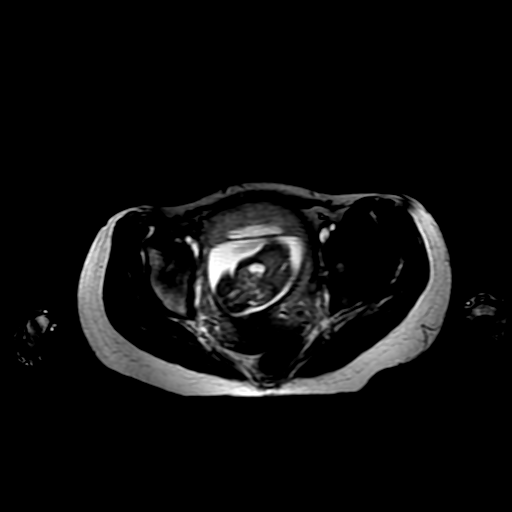
[im 15/53]
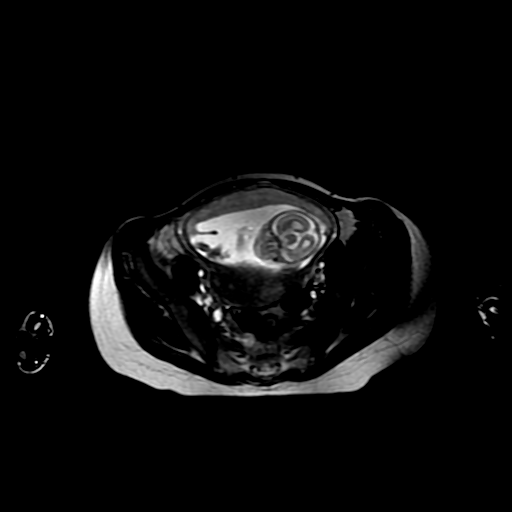
[im 23/53]
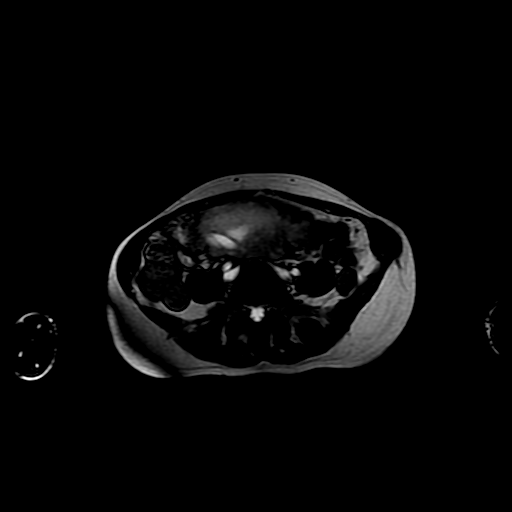
[im 30/53]
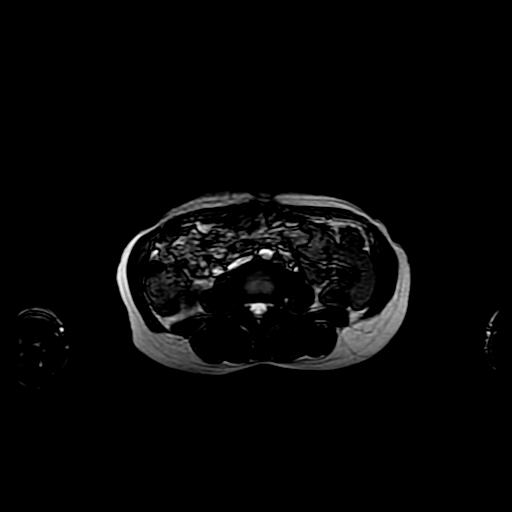
[im 38/53]
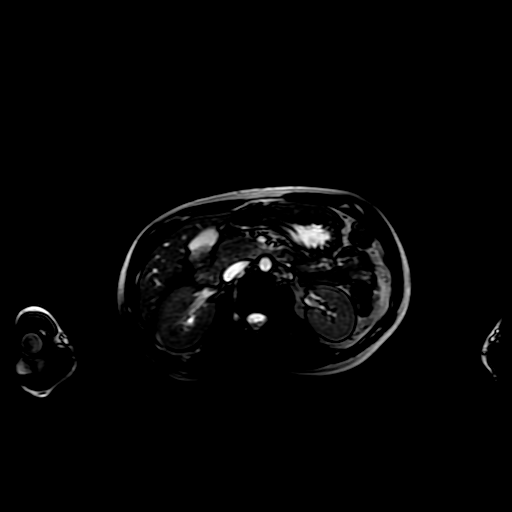
[im 45/53]
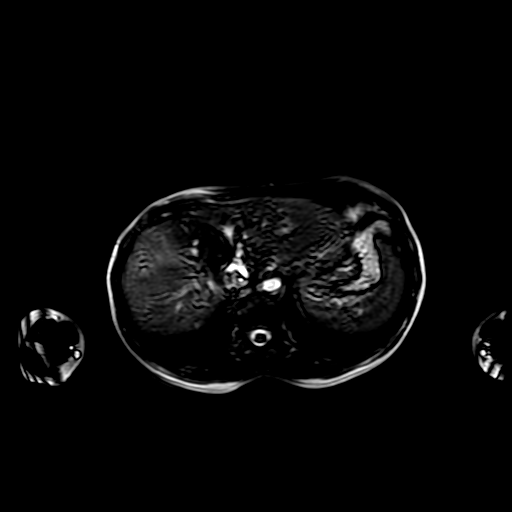
[im 53/53]
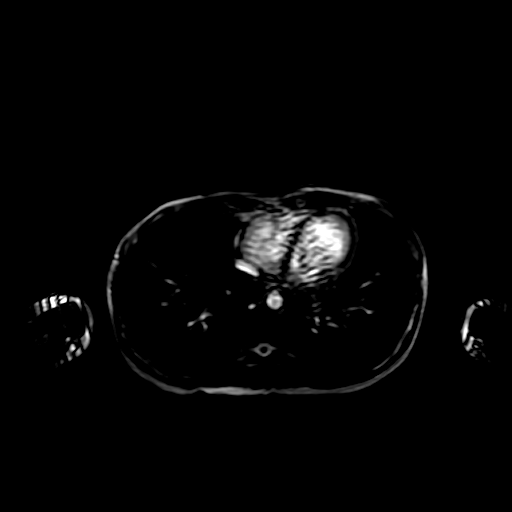

[Series 5: T2 · coronal · 10.0mm · 0.86mm/px · 3 of 21 slices shown]
[im 1/21]
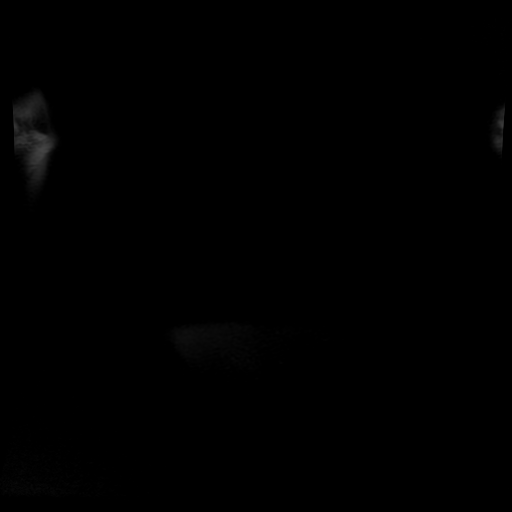
[im 11/21]
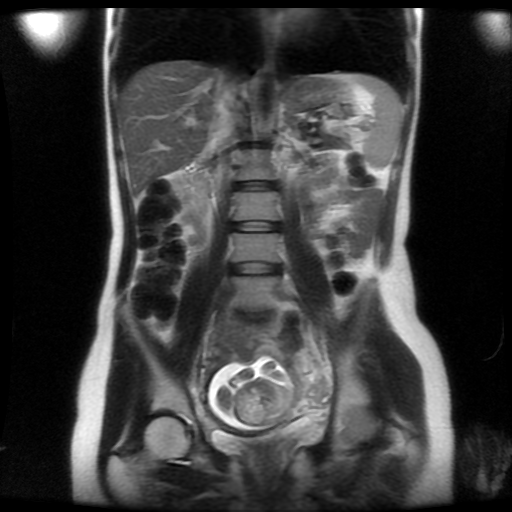
[im 21/21]
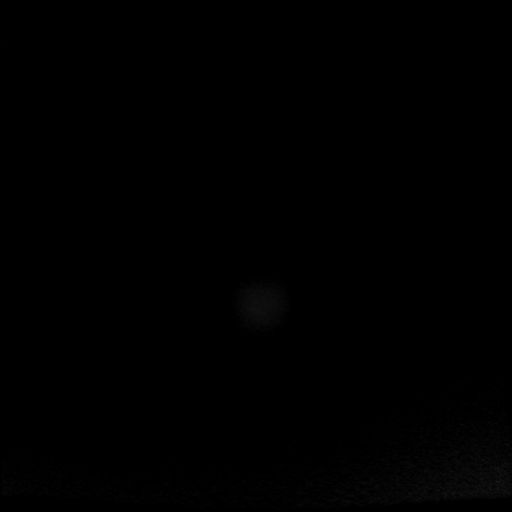

[Series 6: T2 fat-sat · coronal · 10.0mm · 0.86mm/px · 3 of 21 slices shown (1 of 2)]
[im 1/21]
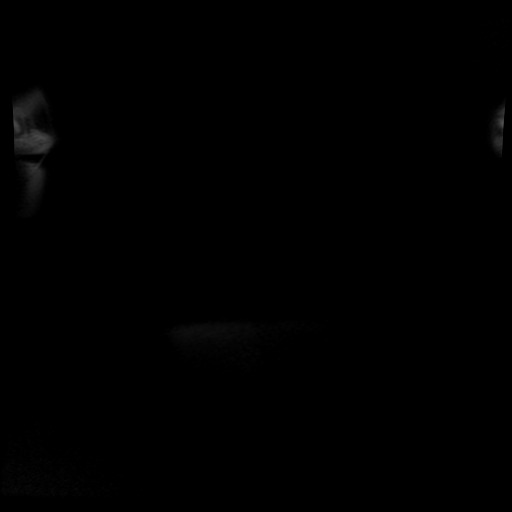
[im 11/21]
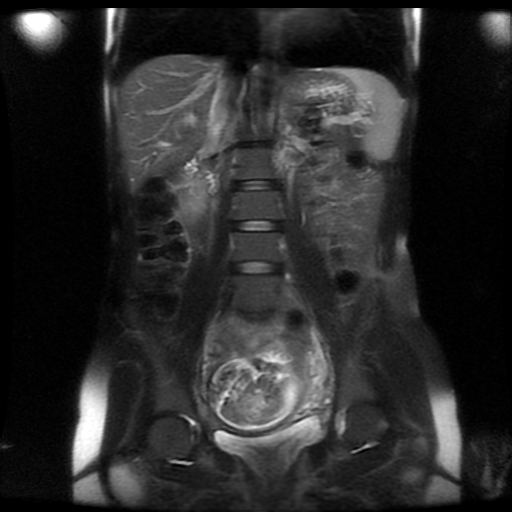
[im 21/21]
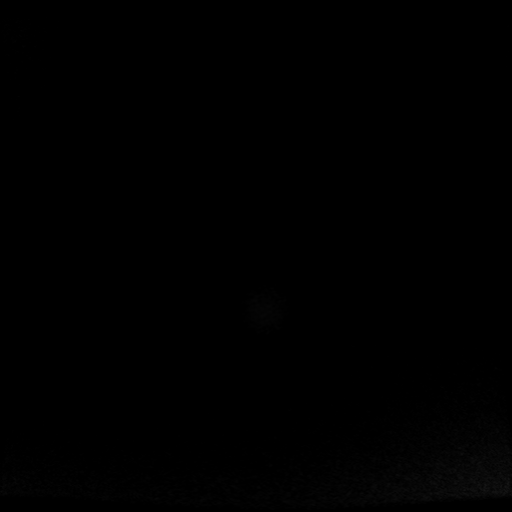

[Series 7: T2 fat-sat · axial · 10.0mm · 0.78mm/px · z∈[-278,+62]mm · 5 of 42 slices shown (2 of 2)]
[im 1/42]
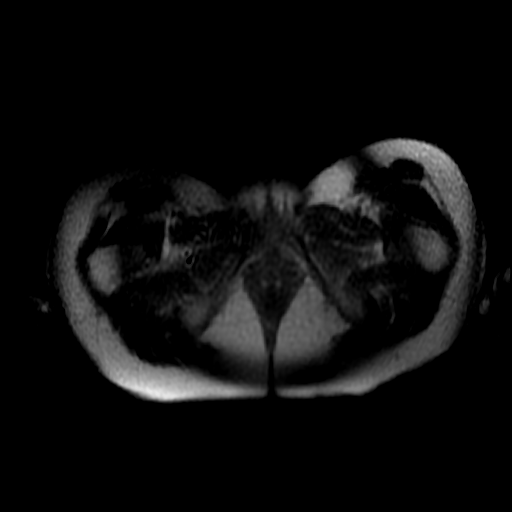
[im 7/42]
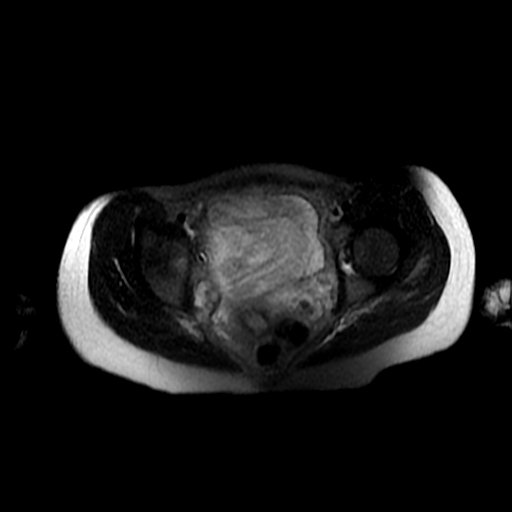
[im 14/42]
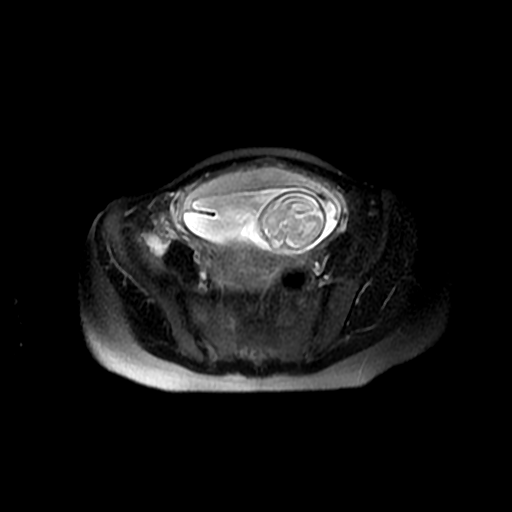
[im 21/42]
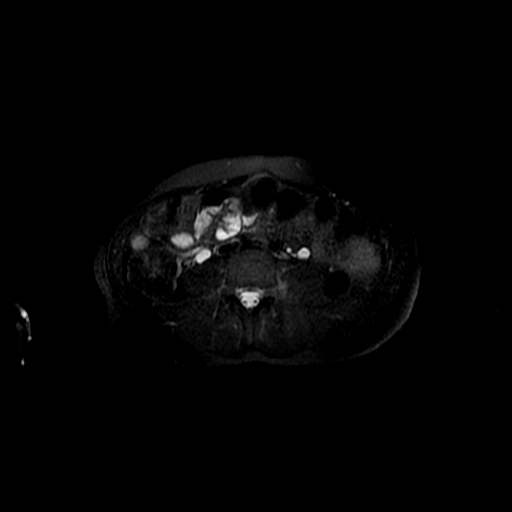
[im 35/42]
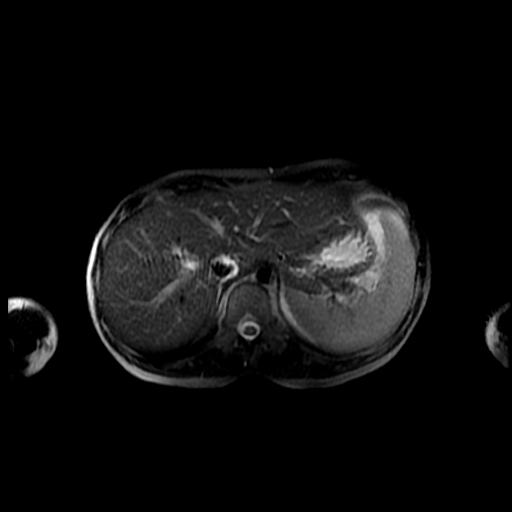

[19 of 48 positions shown; findings below may reference images not displayed]

FINDINGS: COMBINED FINDINGS FOR BOTH MR ABDOMEN AND PELVIS

Lower chest: No acute findings.

Hepatobiliary: No mass visualized on this unenhanced exam.
Gallbladder is unremarkable. No evidence of biliary ductal
dilatation.

Pancreas: No mass or inflammatory process visualized on this
unenhanced exam.

Spleen:  Within normal limits in size.

Adrenals/Urinary tract: Unremarkable. No evidence of mass or
hydronephrosis.

Stomach/Bowel: Although the appendix is not directly visualized, no
inflammatory process seen in region of the cecum or elsewhere. No
evidence of dilated bowel loops, bowel wall thickening, or abnormal
fluid collections.

Vascular/Lymphatic: No pathologically enlarged lymph nodes
identified. No evidence of abdominal aortic aneurysm.

Reproductive: A single intrauterine fetus is seen in breech
presentation. No evidence of pelvic mass, inflammatory process, or
abnormal fluid collections.

Other:  None.

Musculoskeletal:  No suspicious bone lesions identified.
IMPRESSION: Single intrauterine fetus in breech presentation.

No evidence of appendicitis, hydronephrosis, or other acute
findings.

## 2020-07-03 MED ORDER — ONDANSETRON HCL 4 MG/2ML IJ SOLN
4.0000 mg | Freq: Once | INTRAMUSCULAR | Status: AC
  Administered 2020-07-03: 4 mg via INTRAVENOUS
  Filled 2020-07-03: qty 2

## 2020-07-03 MED ORDER — MIDAZOLAM HCL 2 MG/2ML IJ SOLN: 2.0000 mg | Freq: Once | INTRAMUSCULAR | Status: DC

## 2020-07-03 MED ORDER — ACETAMINOPHEN 500 MG PO TABS
1000.0000 mg | ORAL_TABLET | Freq: Once | ORAL | Status: AC
  Administered 2020-07-03: 1000 mg via ORAL
  Filled 2020-07-03: qty 2

## 2020-07-03 NOTE — ED Notes (Signed)
ED TO INPATIENT HANDOFF REPORT  Name/Age/Gender Susan Neal 35 y.o. female  Code Status   Home/SNF/Other Home  Chief Complaint Pregnancy Complication  and Faint Spells   Level of Care/Admitting Diagnosis ED Disposition    ED Disposition Condition Comment   Transfer to Another Facility  The patient appears reasonably stabilized for transfer considering the current resources, flow, and capabilities available in the ED at this time, and I doubt any other [facility] requiring further screening and/or treatment in the ED prior to transfer is p resent.       Medical History Past Medical History:  Diagnosis Date  . Anxiety     Allergies Allergies  Allergen Reactions  . Tramadol     IV Location/Drains/Wounds Patient Lines/Drains/Airways Status    Active Line/Drains/Airways    Name Placement date Placement time Site Days   Peripheral IV 07/02/20 Left;Lateral Forearm 07/02/20  2317  Forearm  1          Labs/Imaging Results for orders placed or performed during the hospital encounter of 07/02/20 (from the past 48 hour(s))  CBG monitoring, ED     Status: None   Collection Time: 07/02/20  9:41 PM  Result Value Ref Range   Glucose-Capillary 95 70 - 99 mg/dL    Comment: Glucose reference range applies only to samples taken after fasting for at least 8 hours.  Comprehensive metabolic panel     Status: Abnormal   Collection Time: 07/02/20 10:43 PM  Result Value Ref Range   Sodium 133 (L) 135 - 145 mmol/L   Potassium 3.7 3.5 - 5.1 mmol/L   Chloride 105 98 - 111 mmol/L   CO2 20 (L) 22 - 32 mmol/L   Glucose, Bld 105 (H) 70 - 99 mg/dL    Comment: Glucose reference range applies only to samples taken after fasting for at least 8 hours.   BUN 10 6 - 20 mg/dL   Creatinine, Ser 9.37 0.44 - 1.00 mg/dL   Calcium 9.2 8.9 - 16.9 mg/dL   Total Protein 7.9 6.5 - 8.1 g/dL   Albumin 4.0 3.5 - 5.0 g/dL   AST 19 15 - 41 U/L   ALT 14 0 - 44 U/L   Alkaline Phosphatase 47 38 - 126 U/L    Total Bilirubin 0.6 0.3 - 1.2 mg/dL   GFR, Estimated >67 >89 mL/min    Comment: (NOTE) Calculated using the CKD-EPI Creatinine Equation (2021)    Anion gap 8 5 - 15    Comment: Performed at [facility], [address] [place], [place] [address]  CBC with Differential/Platelet     Status: Abnormal   Collection Time: 07/02/20 10:43 PM  Result Value Ref Range   WBC 11.0 (H) 4.0 - 10.5 K/uL   RBC 3.48 (L) 3.87 - 5.11 MIL/uL   Hemoglobin 11.4 (L) 12.0 - 15.0 g/dL   HCT 75.1 (L) 02.5 - 85.2 %   MCV 94.8 80.0 - 100.0 fL   MCH 32.8 26.0 - 34.0 pg   MCHC 34.5 30.0 - 36.0 g/dL   RDW 77.8 24.2 - 35.3 %   Platelets 252 150 - 400 K/uL   nRBC 0.0 0.0 - 0.2 %   Neutrophils Relative % 78 %   Neutro Abs 8.6 (H) 1.7 - 7.7 K/uL   Lymphocytes Relative 15 %   Lymphs Abs 1.7 0.7 - 4.0 K/uL   Monocytes Relative 5 %   Monocytes Absolute 0.6 0.1 - 1.0 K/uL   Eosinophils Relative  1 %   Eosinophils Absolute 0.1 0.0 - 0.5 K/uL   Basophils Relative 0 %   Basophils Absolute 0.0 0.0 - 0.1 K/uL   Immature Granulocytes 1 %   Abs Immature Granulocytes 0.07 0.00 - 0.07 K/uL    Comment: Performed at [facility], [address] [place], [place] [address]  Urinalysis, Routine w reflex microscopic Urine, Clean Catch     Status: Abnormal   Collection Time: 07/03/20 12:45 AM  Result Value Ref Range   Color, Urine YELLOW YELLOW   APPearance CLEAR CLEAR   Specific Gravity, Urine 1.014 1.005 - 1.030   pH 6.0 5.0 - 8.0   Glucose, UA NEGATIVE NEGATIVE mg/dL   Hgb urine dipstick MODERATE (A) NEGATIVE   Bilirubin Urine NEGATIVE NEGATIVE   Ketones, ur NEGATIVE NEGATIVE mg/dL   Protein, ur NEGATIVE NEGATIVE mg/dL   Nitrite NEGATIVE NEGATIVE   Leukocytes,Ua NEGATIVE NEGATIVE   RBC / HPF 0-5 0 - 5 RBC/hpf   WBC, UA 0-5 0 - 5 WBC/hpf   Bacteria, UA NONE SEEN NONE SEEN   Squamous Epithelial / LPF 0-5 0 - 5   Mucus PRESENT    Hyaline Casts, UA PRESENT     Comment: Performed at  [facility], [address] [place], [place] [address]   US OB Limited > 14 wks  Result Date: 07/02/2020 CLINICAL DATA:  35 year old pregnant female with right lower quadrant abdominal pain. Concern for ovarian torsion. Provided gestational age of [redacted] weeks, 4 days. EXAM: LIMITED OBSTETRIC ULTRASOUND AN DOPPLER FINDINGS: Number of Fetuses: 1 Heart Rate:  149 bpm Movement: Detected Presentation: Breech Placental Location: Anterior Previa: No Amniotic Fluid (Subjective):  Within normal limits. BPD:  4.2cm 18w 6d MATERNAL FINDINGS: Cervix:  Appears closed. Uterus/Adnexae: No abnormality visualized. Pulsed Doppler evaluation of both ovaries demonstrates normal low-resistance arterial and venous waveforms. Other findings There is a band like structure along the posterior aspect of the placenta which may represent fold but concerning for amniotic band. Complete ultrasound with fetal anatomy survey is recommended on a non emergent/outpatient basis. IMPRESSION: 1. Single live intrauterine pregnancy with an estimated gestational age of [redacted] weeks, 6 days based on biparietal diameter. 2. Band like structure posterior to the placenta concerning for amniotic band. Dedicated second trimester ultrasound with fetal anatomy scan is recommended on nonemergent/outpatient basis. 3. Unremarkable ovaries. This exam is performed on an emergent basis and does not comprehensively evaluate fetal size, dating, or anatomy; follow-up complete OB US should be considered if further fetal assessment is warranted. Electronically Signed   By: Elgie Collard M.D.   On: 07/02/2020 23:35   US PELVIC DOPPLER (TORSION R/O OR MASS ARTERIAL FLOW)  Result Date: 07/02/2020 CLINICAL DATA:  35 year old pregnant female with right lower quadrant abdominal pain. Concern for ovarian torsion. Provided gestational age of [redacted] weeks, 4 days. EXAM: LIMITED OBSTETRIC ULTRASOUND AN DOPPLER FINDINGS: Number of Fetuses: 1 Heart Rate:  149 bpm  Movement: Detected Presentation: Breech Placental Location: Anterior Previa: No Amniotic Fluid (Subjective):  Within normal limits. BPD:  4.2cm 18w 6d MATERNAL FINDINGS: Cervix:  Appears closed. Uterus/Adnexae: No abnormality visualized. Pulsed Doppler evaluation of both ovaries demonstrates normal low-resistance arterial and venous waveforms. Other findings There is a band like structure along the posterior aspect of the placenta which may represent fold but concerning for amniotic band. Complete ultrasound with fetal anatomy survey is recommended on a non emergent/outpatient basis. IMPRESSION: 1. Single live intrauterine pregnancy with an estimated gestational age of  [redacted] weeks, 6 days based on biparietal diameter. 2. Band like structure posterior to the placenta concerning for amniotic band. Dedicated second trimester ultrasound with fetal anatomy scan is recommended on nonemergent/outpatient basis. 3. Unremarkable ovaries. This exam is performed on an emergent basis and does not comprehensively evaluate fetal size, dating, or anatomy; follow-up complete OB US should be considered if further fetal assessment is warranted. Electronically Signed   By: Elgie Collard M.D.   On: 07/02/2020 23:35    Pending Labs Unresulted Labs (From admission, onward)         None      Vitals/Pain Today's Vitals   07/02/20 2300 07/02/20 2330 07/03/20 0000 07/03/20 0124  BP: 105/63 116/75 120/75 107/60  Pulse: 72 69 69 60  Resp: 18 12 18 10   Temp:      TempSrc:      SpO2: 99% 100% 100% 100%  Weight:      Height:      PainSc:        Isolation Precautions No active isolations  Medications Medications  sodium chloride 0.9 % bolus 1,000 mL (0 mLs Intravenous Stopped 07/03/20 0011)    Mobility walks

## 2020-07-03 NOTE — ED Provider Notes (Signed)
Medical Decision Making: Care of patient assumed from Dr. Jacqulyn Bath at 0700.  Agree with history, physical exam and plan.  See their note for further details.  Briefly, The pt p/w RLQ pain.   Current plan is as follows: MRI RLQ and dispo per results  MRI imaging is complete.  Patient comes back comfortable.  Benzodiazepines were prescribed for as needed anxiety after review of literature for safety in pregnancy.  Patient did not wind up needing it.  MRI does not definitively see the appendix but does not see any focal signs of appendicitis.  Patient agrees this is safe, I consulted general surgery to make sure there is no further work-up other than outpatient follow-up with return precautions.  Dr. Janee Morn Dr. Marlyne Beards agree that this is safe for DC with return precautions.   I personally reviewed and interpreted all labs/imaging.      Sabino Donovan, MD 07/03/20 [phone]

## 2020-07-03 NOTE — ED Notes (Signed)
Pt transported to MRI 

## 2020-07-03 NOTE — ED Triage Notes (Signed)
[facility] reports that pt is [redacted] weeks pregnant. Came from [facility] for concern for amniotic band.She came in to the ER with c/o right lower quadrant pain.

## 2020-07-03 NOTE — ED Notes (Signed)
Patient transported to MRI 

## 2020-07-03 NOTE — ED Notes (Signed)
Pt updated with plan of care - waiting on MRI and has to be NPO at this time - pt reports that she is nauseous and has a headache from not eating - this RN contacted MRI and pt is next - MD notified of pt symptoms - MD to put in med orders

## 2020-07-03 NOTE — ED Notes (Signed)
[facility] called to transport pt to [facility].

## 2020-07-07 ENCOUNTER — Other Ambulatory Visit: Payer: Medicaid Other

## 2020-07-08 ENCOUNTER — Ambulatory Visit: Payer: Medicaid Other | Attending: Obstetrics and Gynecology

## 2020-07-08 ENCOUNTER — Other Ambulatory Visit: Payer: Self-pay

## 2020-07-08 ENCOUNTER — Other Ambulatory Visit: Payer: Self-pay | Admitting: Obstetrics and Gynecology

## 2020-07-08 DIAGNOSIS — Z363 Encounter for antenatal screening for malformations: Secondary | ICD-10-CM

## 2020-07-08 IMAGING — US US MFM OB DETAIL+14 WK
1 series · 12 of 28 positions shown · non-contrast
Comparison: none

[Series 1: us mfm ob detail+14 wk · 12 of 149 slices shown]
[im 6/149]
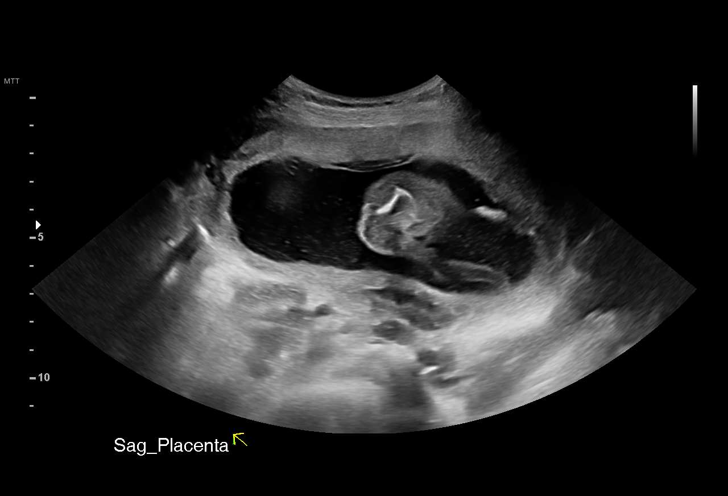
[im 17/149]
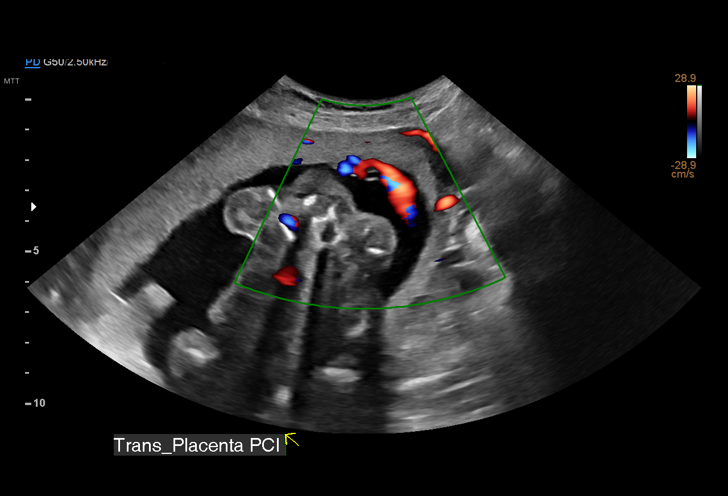
[im 28/149]
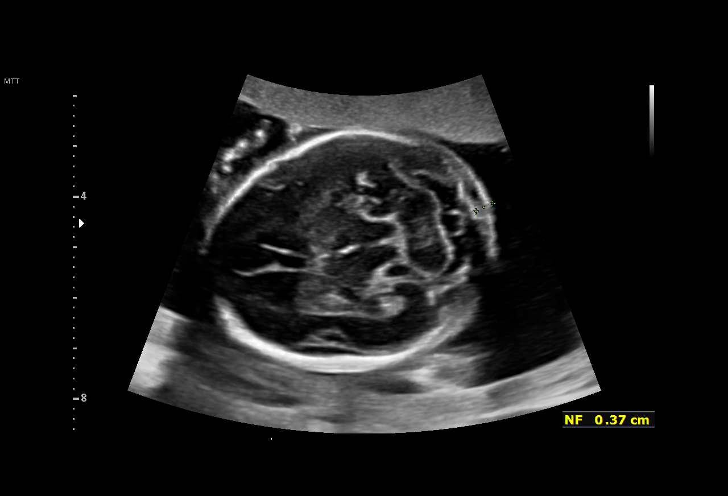
[im 44/149]
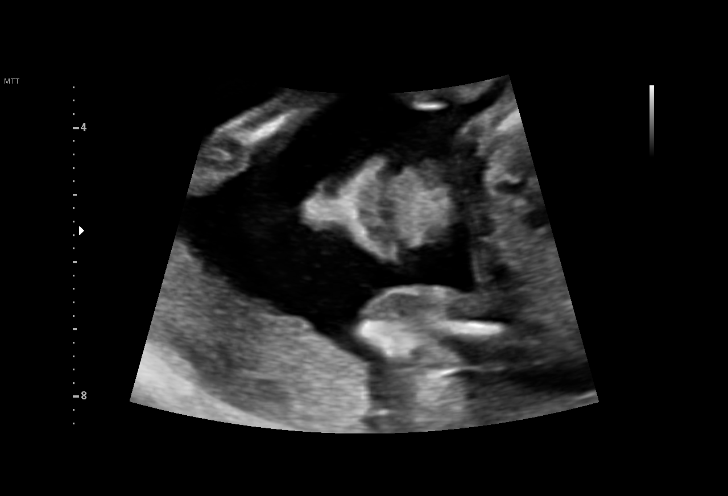
[im 55/149]
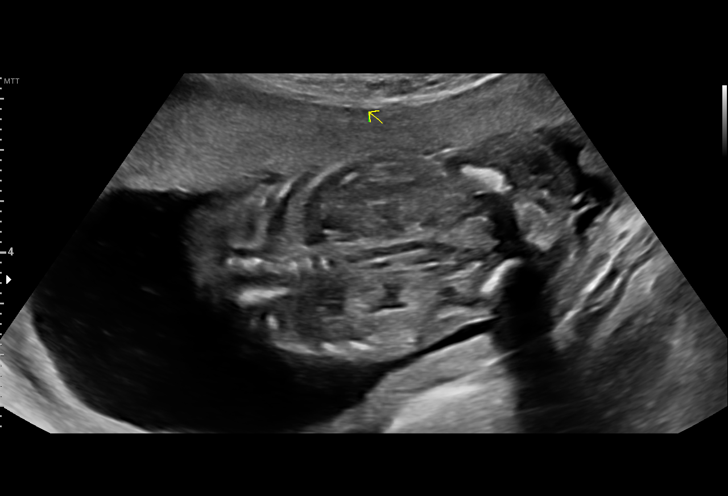
[im 66/149]
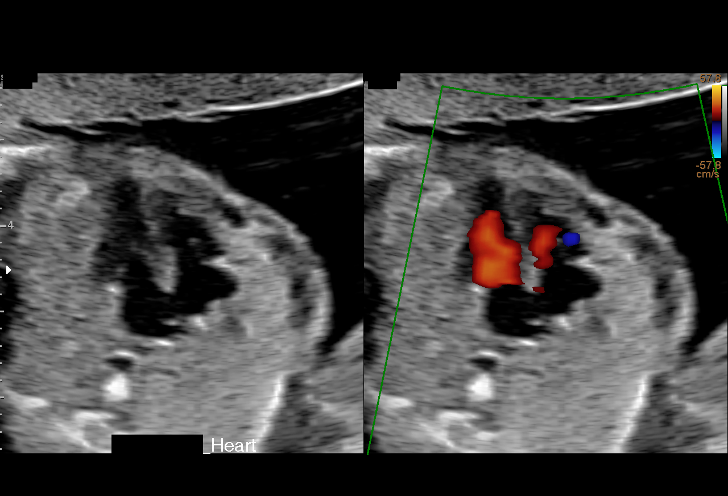
[im 83/149]
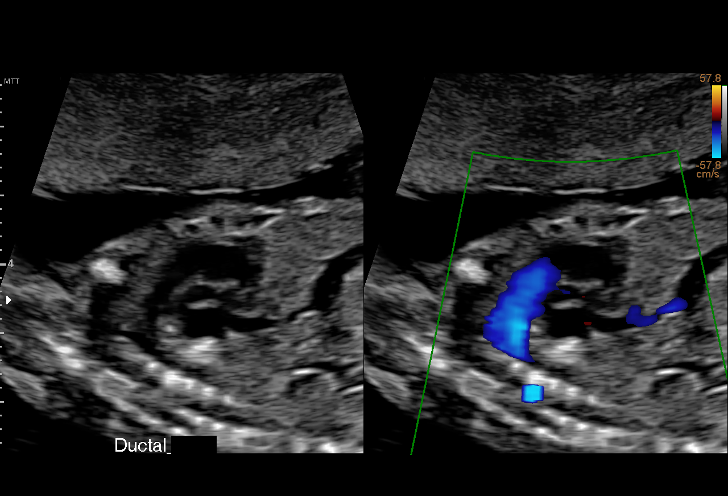
[im 94/149]
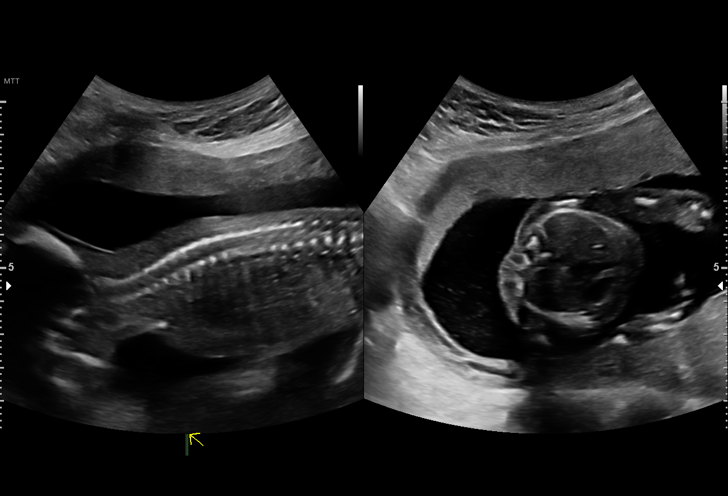
[im 105/149]
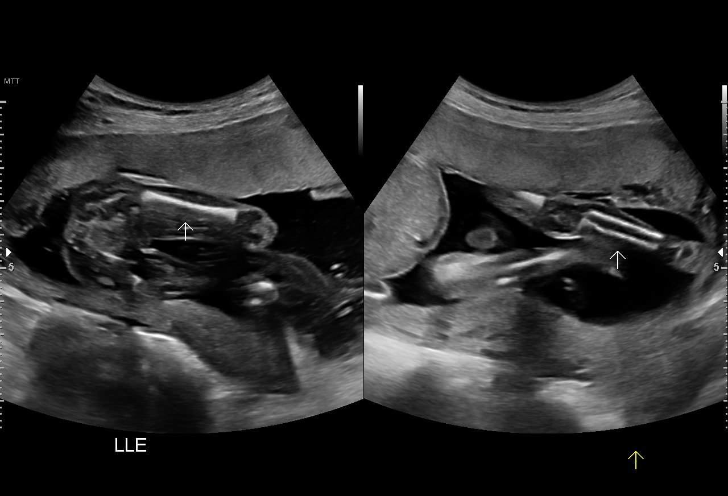
[im 121/149]
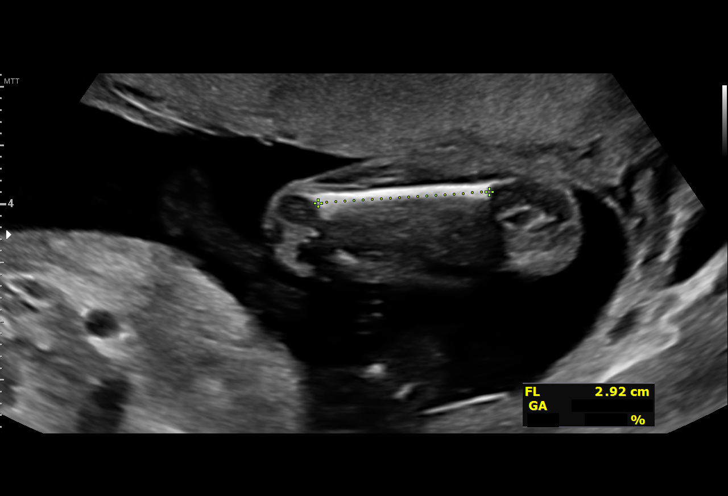
[im 132/149]
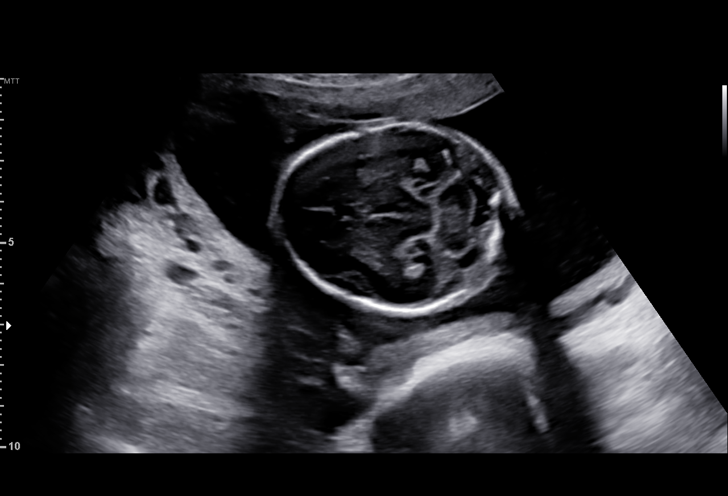
[im 143/149]
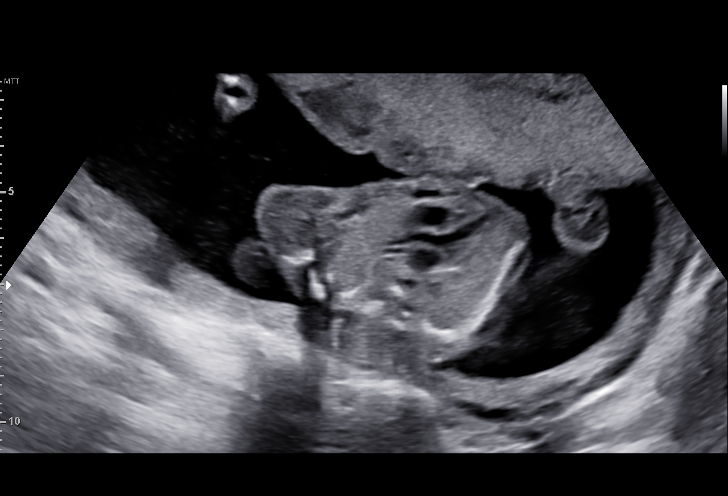

[12 of 28 positions shown; findings below may reference images not displayed]

OBGYN
                                                            [REDACTED]
                   DALAM

 2  US MFM OB TRANSVAGINAL                76817.2     DALAM

Indications

 Abnormal fetal ultrasound (? Amniotic band     [1R]
 on ED study)
 19 weeks gestation of pregnancy
 Encounter for antenatal screening for          [1R]
 malformations
 Previous cervical surgery                      [1R]
 LR NIPS
Fetal Evaluation

 Num Of Fetuses:         1
 Fetal Heart Rate(bpm):  147
 Cardiac Activity:       Observed
 Presentation:           Variable
 Placenta:               Anterior
 P. Cord Insertion:      Visualized, central

 Amniotic Fluid
 AFI FV:      Within normal limits

                             Largest Pocket(cm)


 Comment:    ? Synechiae? anterior uterus from Right to lower Left
Biometry

 BPD:      45.2  mm     G. Age:  19w 5d         67  %    CI:        76.11   %    70 - 86
                                                         FL/HC:      18.3   %    16.1 -
 HC:      164.2  mm     G. Age:  19w 1d         36  %    HC/AC:      1.12        1.09 -
 AC:      146.6  mm     G. Age:  20w 0d         68  %    FL/BPD:     66.6   %
 FL:       30.1  mm     G. Age:  19w 2d         43  %    FL/AC:      20.5   %    20 - 24
 HUM:      27.6  mm     G. Age:  18w 6d         39  %
 CER:        19  mm     G. Age:  18w 4d         20  %
 NFT:       3.7  mm
 LV:        6.5  mm
 CM:        3.8  mm

 Est. FW:     302  gm    0 lb 11 oz      65  %
OB History

 Blood Type:   A+
 Gravidity:    6         Term:   3        Prem:   0        SAB:   2
 TOP:          0       Ectopic:  0        Living: 3
Gestational Age

 LMP:           25w 5d        Date:  [DATE]                 EDD:   [DATE]
 Clinical EDD:  19w 2d                                        EDD:   [DATE]
 U/S Today:     19w 4d                                        EDD:   [DATE]
 Best:          19w 2d     Det. By:  Clinical EDD             EDD:   [DATE]
Anatomy

 Cranium:               Appears normal         Aortic Arch:            Appears normal
 Cavum:                 Appears normal         Ductal Arch:            Appears normal
 Ventricles:            Appears normal         Diaphragm:              Appears normal
 Choroid Plexus:        Appears normal         Stomach:                Appears normal, left
                                                                       sided
 Cerebellum:            Appears normal         Abdomen:                Appears normal
 Posterior Fossa:       Appears normal         Abdominal Wall:         Appears nml (cord
                                                                       insert, abd wall)
 Nuchal Fold:           Appears normal         Cord Vessels:           Appears normal (3
                                                                       vessel cord)
 Face:                  Appears normal         Kidneys:                Appear normal
                        (orbits and profile)
 Lips:                  Appears normal         Bladder:                Appears normal
 Thoracic:              Appears normal         Spine:                  Appears normal
 Heart:                 Appears normal         Upper Extremities:      Appears normal
                        (4CH, axis, and
                        situs)
 RVOT:                  Appears normal         Lower Extremities:      Appears normal
 LVOT:                  Appears normal

 Other:  Fetus appears to be female. Nasal bone visualized. Heels/feet and
         open hands/5th digits visualized. Technically difficult due to fetal
         position.
Cervix Uterus Adnexa
 Cervix
 Length:              3  cm.
 Normal appearance by transvaginal scan

 Uterus
 No abnormality visualized.

 Right Ovary
 Within normal limits.

 Left Ovary
 Within normal limits.

 Cul De Sac
 No free fluid seen.

 Adnexa
 No abnormality visualized.
Comments

 This patient was seen for a detailed fetal anatomy scan.  The
 patient reports 3 prior full-term vaginal deliveries.  She has a
 history of a LEEP.  She reports that she was evaluated in the
 emergency room due to abdominal pain a few days ago.  She
 had an abdominal ultrasound performed at that time that
 showed a possible amniotic band.  She reports that her
 abdominal pain has resolved.
 She denies any significant past medical history and denies
 any problems in her current pregnancy.
 She had a cell free DNA test earlier in her pregnancy which
 indicated a low risk for trisomy 21, 18, and 13. A female fetus
 is predicted.
 She was informed that the fetal growth and amniotic fluid
 level were appropriate for her gestational age.
 There were no obvious fetal anomalies noted on today's
 ultrasound exam.
 An intrauterine synechiae was noted on today's exam.  This
 synechiae extends from the anterior placenta to the posterior
 portion of her uterus and is located near the fundus of her
 uterus.  The fetus was located below the synechiae.  The
 patient was advised that her baby will most likely not be
 affected by this synechiae and that the synechiae probably
 developed as a result of adhesions from her prior
 pregnancies.
 The patient was informed that anomalies may be missed due
 to technical limitations. If the fetus is in a suboptimal position
 or maternal habitus is increased, visualization of the fetus in
 the maternal uterus may be impaired.
 A transvaginal ultrasound performed today due to her prior
 LEEP showed a cervical length of 3.0 cm long without any
 signs of funneling.  The patient was reassured that based on
 her cervical length, her risk of a preterm birth is low at this
 time.
 Due to the intrauterine synechiae, a follow-up exam was
 scheduled in 6 weeks.

## 2020-07-09 ENCOUNTER — Other Ambulatory Visit: Payer: Self-pay | Admitting: *Deleted

## 2020-07-09 DIAGNOSIS — N856 Intrauterine synechiae: Secondary | ICD-10-CM

## 2020-08-12 ENCOUNTER — Encounter: Payer: Self-pay | Admitting: *Deleted

## 2020-08-19 ENCOUNTER — Encounter: Payer: Self-pay | Admitting: *Deleted

## 2020-08-19 ENCOUNTER — Other Ambulatory Visit: Payer: Self-pay

## 2020-08-19 ENCOUNTER — Ambulatory Visit: Payer: Medicaid Other | Admitting: *Deleted

## 2020-08-19 ENCOUNTER — Other Ambulatory Visit: Payer: Self-pay | Admitting: *Deleted

## 2020-08-19 ENCOUNTER — Ambulatory Visit: Payer: Medicaid Other | Attending: Obstetrics

## 2020-08-19 VITALS — BP 101/58 | HR 71

## 2020-08-19 DIAGNOSIS — O289 Unspecified abnormal findings on antenatal screening of mother: Secondary | ICD-10-CM

## 2020-08-19 DIAGNOSIS — Z363 Encounter for antenatal screening for malformations: Secondary | ICD-10-CM

## 2020-08-19 DIAGNOSIS — Z3A25 25 weeks gestation of pregnancy: Secondary | ICD-10-CM | POA: Diagnosis not present

## 2020-08-19 DIAGNOSIS — N856 Intrauterine synechiae: Secondary | ICD-10-CM

## 2020-08-19 DIAGNOSIS — O283 Abnormal ultrasonic finding on antenatal screening of mother: Secondary | ICD-10-CM | POA: Insufficient documentation

## 2020-08-19 DIAGNOSIS — O3442 Maternal care for other abnormalities of cervix, second trimester: Secondary | ICD-10-CM

## 2020-08-19 IMAGING — US US MFM OB FOLLOW-UP
1 series · 13 of 28 positions shown · non-contrast
Comparison: none

[Series 1: us mfm ob follow-up · 77 acquisitions, 13 frames shown]
[im 3/77]
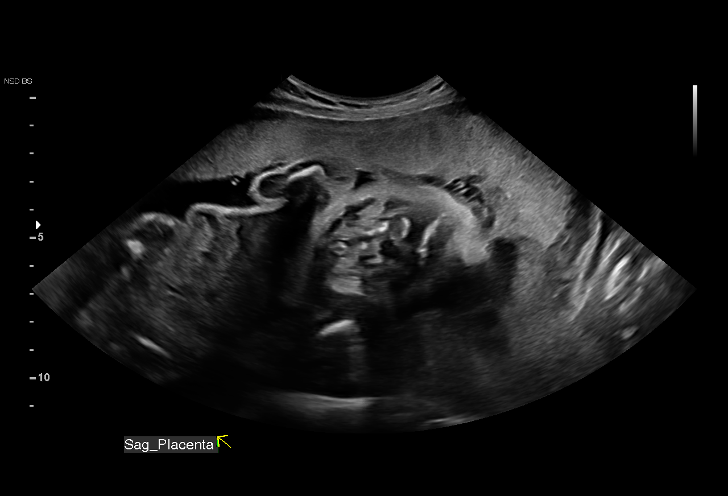
[im 9/77]
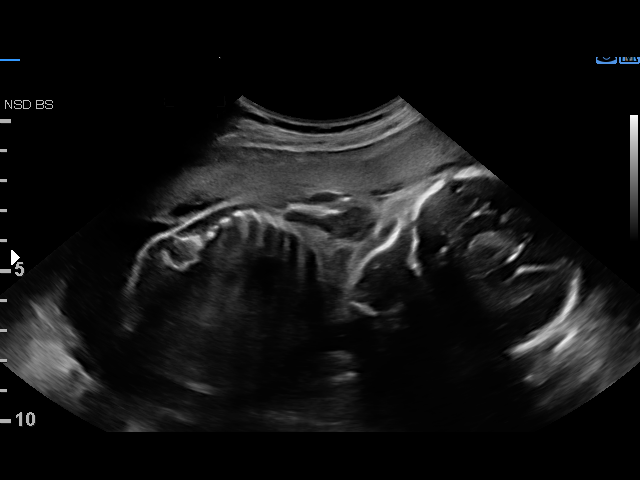
[im 15/77]
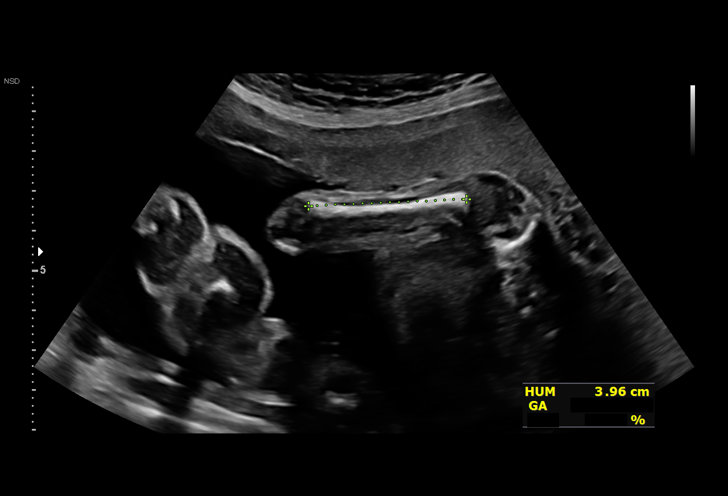
[im 20/77]
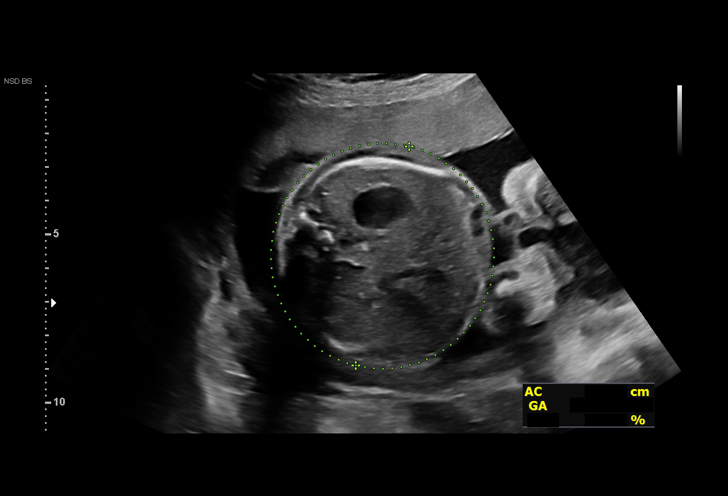
[im 26/77]
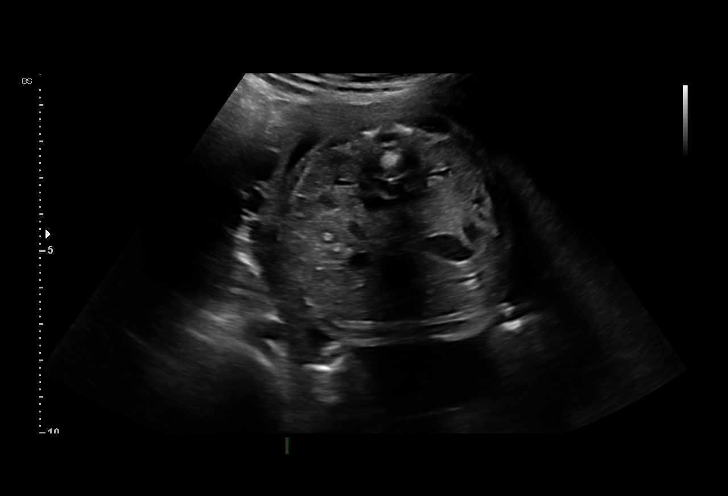
[im 31/77]
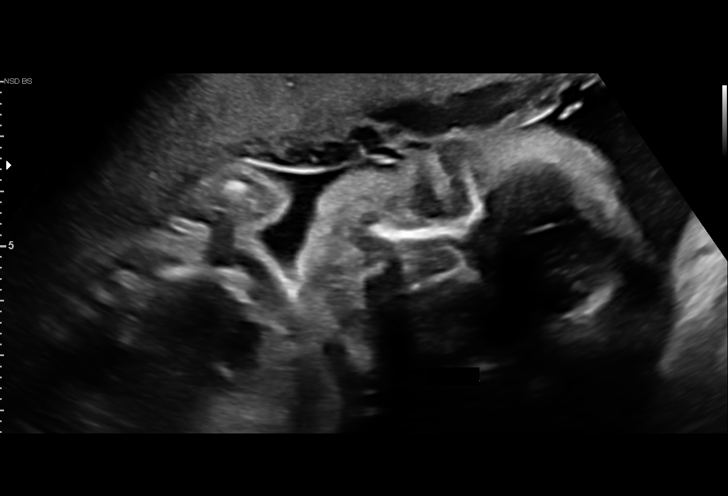
[im 40/77]
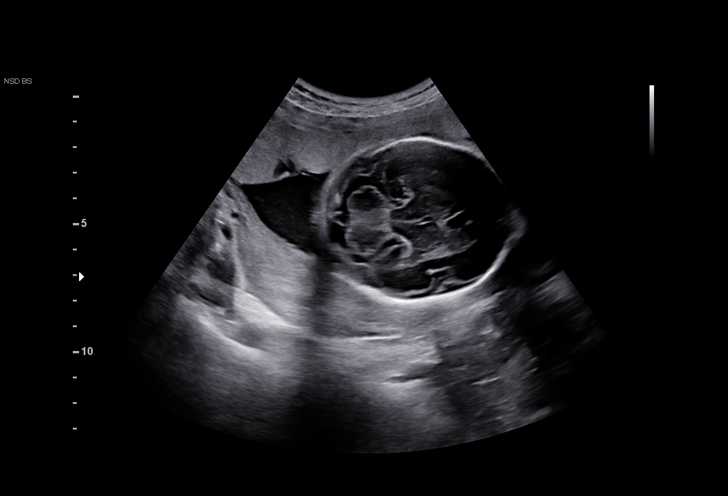
[im 46/77]
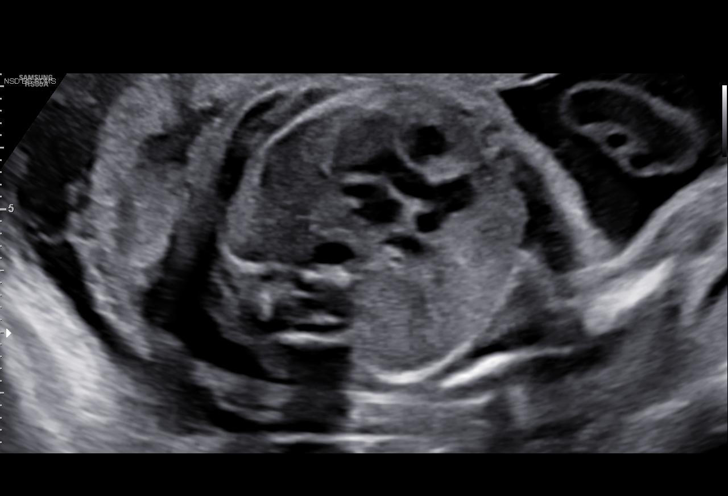
[im 51/77]
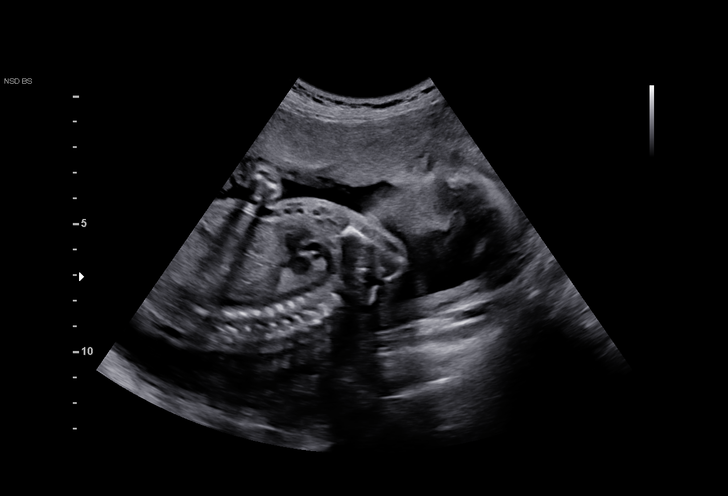
[im 57/77]
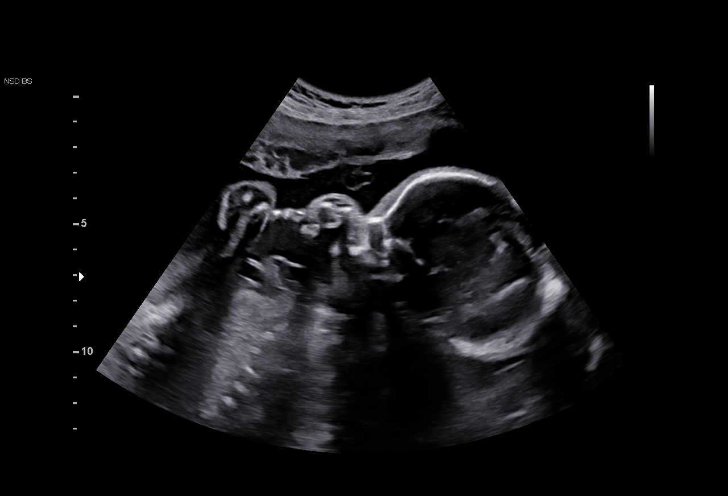
[im 62/77]
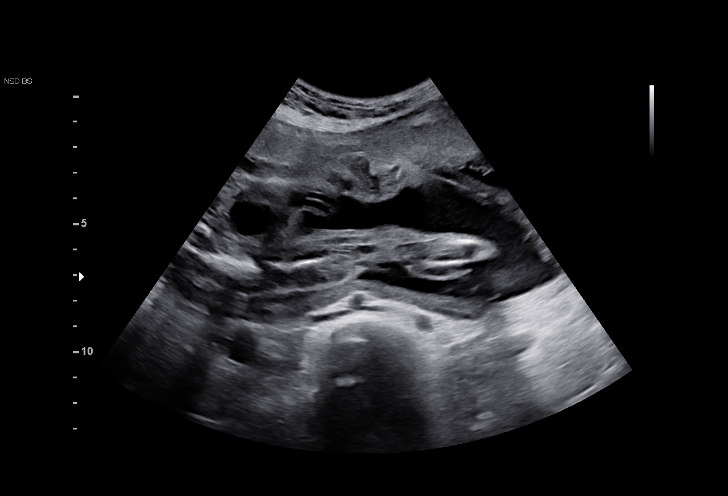
[im 68/77]
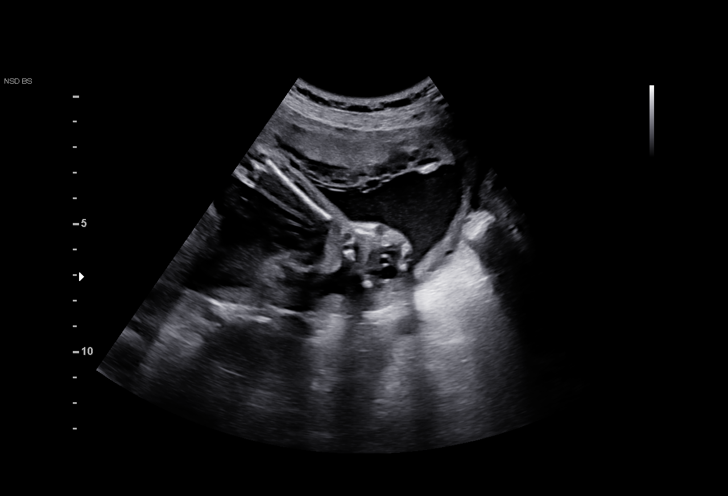
[im 74/77]
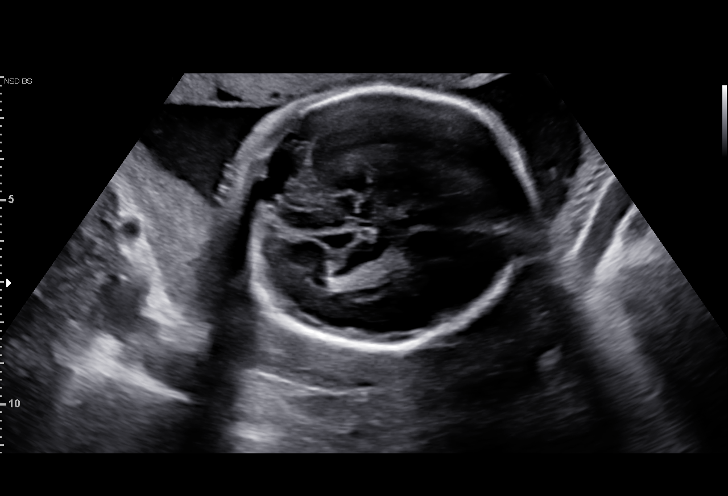

[13 of 28 positions shown; findings below may reference images not displayed]

OBGYN
                                                            [REDACTED]
                   [facility]ANT[facility]

Indications

 Abnormal fetal ultrasound (? Amniotic band     [Y9]
 on ED study)
 25 weeks gestation of pregnancy
 Encounter for antenatal screening for          [Y9]
 malformations
 Previous cervical surgery (LEEP)               [Y9]
 LR NIPS
Fetal Evaluation

 Num Of Fetuses:         1
 Fetal Heart Rate(bpm):  148
 Cardiac Activity:       Observed
 Presentation:           Cephalic
 Placenta:               Anterior
 P. Cord Insertion:      Previously seen as normal

 Amniotic Fluid
 AFI FV:      Within normal limits

                             Largest Pocket(cm)

Biometry
 BPD:      64.1  mm     G. Age:  25w 6d         64  %    CI:        75.52   %    70 - 86
                                                         FL/HC:      18.8   %    18.7 -
 HC:      233.9  mm     G. Age:  25w 3d         32  %    HC/AC:      1.12        1.04 -
 AC:      208.5  mm     G. Age:  25w 3d         45  %    FL/BPD:     68.5   %    71 - 87
 FL:       43.9  mm     G. Age:  24w 3d         14  %    FL/AC:      21.1   %    20 - 24
 HUM:      40.1  mm     G. Age:  24w 3d         22  %
 LV:        2.4  mm

 Est. FW:     768  gm    1 lb 11 oz      31  %
OB History

 Blood Type:   A+
 Gravidity:    6         Term:   3        Prem:   0        SAB:   2
 TOP:          0       Ectopic:  0        Living: 3
Gestational Age

 LMP:           31w 5d        Date:  [DATE]                 EDD:   [DATE]
 Clinical EDD:  25w 2d                                        EDD:   [DATE]
 U/S Today:     25w 2d                                        EDD:   [DATE]
 Best:          25w 2d     Det. By:  Clinical EDD             EDD:   [DATE]
Anatomy

 Cranium:               Appears normal         Aortic Arch:            Previously seen
 Cavum:                 Previously seen        Ductal Arch:            Previously seen
 Ventricles:            Appears normal         Diaphragm:              Appears normal
 Choroid Plexus:        Previously seen        Stomach:                Appears normal, left
                                                                       sided
 Cerebellum:            Previously seen        Abdomen:                Appears normal
 Posterior Fossa:       Previously seen        Abdominal Wall:         Previously seen
 Nuchal Fold:           Previously seen        Cord Vessels:           Previously seen
 Face:                  Orbits and profile     Kidneys:                Appear normal
                        previously seen
 Lips:                  Previously seen        Bladder:                Appears normal
 Thoracic:              Appears normal         Spine:                  Previously seen
 Heart:                 Appears normal         Upper Extremities:      Previously seen
                        (4CH, axis, and
                        situs)
 RVOT:                  Previously seen        Lower Extremities:      Previously seen
 LVOT:                  Appears normal

 Other:  Female gender previously seen. Nasal bone previously visualized.
         Heels/feet and open hands/5th digits previously visualized.
         Technically difficult due to fetal position.
Cervix Uterus Adnexa

 Cervix
 Length:           3.41  cm.
 Normal appearance by transabdominal scan.

 Right Ovary
 Previously seen
 Left Ovary
 Previously seen.
Comments

 This patient was seen for a follow up exam due to an
 intrauterine synechiae that was noted on her prior ultrasound.
 She denies any problems since her last exam and reports
 feeling vigorous fetal movements throughout the day.
 She was informed that the fetal growth and amniotic fluid
 level appears appropriate for her gestational age.
 The intrauterine synechiae was not noted on today's exam.
 The patient was advised that the synechiae may have
 disappeared when it was torn due to fetal movements.  She
 was reassured that her baby has not been affected by the
 intrauterine synechiae that had been noted earlier in her
 pregnancy.
 A follow-up exam was scheduled in 6 weeks to assess the
 fetal growth and to check the placenta and uterus again.

## 2020-09-30 ENCOUNTER — Ambulatory Visit: Payer: Medicaid Other | Admitting: *Deleted

## 2020-09-30 ENCOUNTER — Other Ambulatory Visit: Payer: Self-pay | Admitting: Obstetrics

## 2020-09-30 ENCOUNTER — Other Ambulatory Visit: Payer: Self-pay

## 2020-09-30 ENCOUNTER — Encounter: Payer: Self-pay | Admitting: *Deleted

## 2020-09-30 ENCOUNTER — Other Ambulatory Visit: Payer: Self-pay | Admitting: *Deleted

## 2020-09-30 ENCOUNTER — Ambulatory Visit: Payer: Medicaid Other | Attending: Obstetrics

## 2020-09-30 VITALS — BP 118/67 | HR 71

## 2020-09-30 DIAGNOSIS — Z9889 Other specified postprocedural states: Secondary | ICD-10-CM

## 2020-09-30 DIAGNOSIS — O3443 Maternal care for other abnormalities of cervix, third trimester: Secondary | ICD-10-CM | POA: Diagnosis not present

## 2020-09-30 DIAGNOSIS — N879 Dysplasia of cervix uteri, unspecified: Secondary | ICD-10-CM

## 2020-09-30 DIAGNOSIS — O365930 Maternal care for other known or suspected poor fetal growth, third trimester, not applicable or unspecified: Secondary | ICD-10-CM | POA: Diagnosis not present

## 2020-09-30 DIAGNOSIS — O289 Unspecified abnormal findings on antenatal screening of mother: Secondary | ICD-10-CM | POA: Diagnosis not present

## 2020-09-30 DIAGNOSIS — Z362 Encounter for other antenatal screening follow-up: Secondary | ICD-10-CM

## 2020-09-30 DIAGNOSIS — N856 Intrauterine synechiae: Secondary | ICD-10-CM

## 2020-09-30 DIAGNOSIS — O3440 Maternal care for other abnormalities of cervix, unspecified trimester: Secondary | ICD-10-CM | POA: Diagnosis present

## 2020-09-30 DIAGNOSIS — Z3A31 31 weeks gestation of pregnancy: Secondary | ICD-10-CM

## 2020-09-30 IMAGING — US US MFM OB FOLLOW-UP
1 series · 13 of 28 positions shown · non-contrast
Comparison: none

[Series 1: us mfm ob follow-up · 83 acquisitions, 13 frames shown]
[im 4/83]
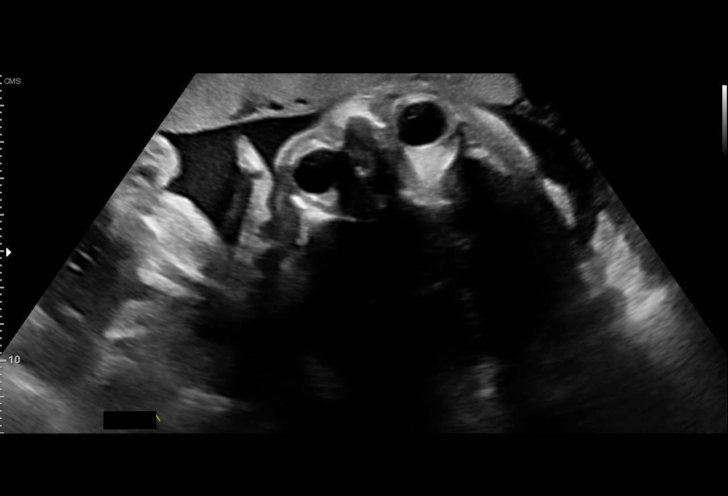
[im 10/83]
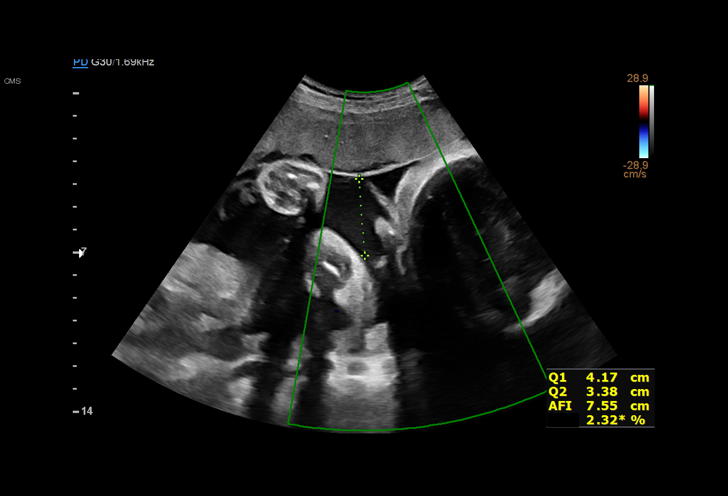
[im 16/83]
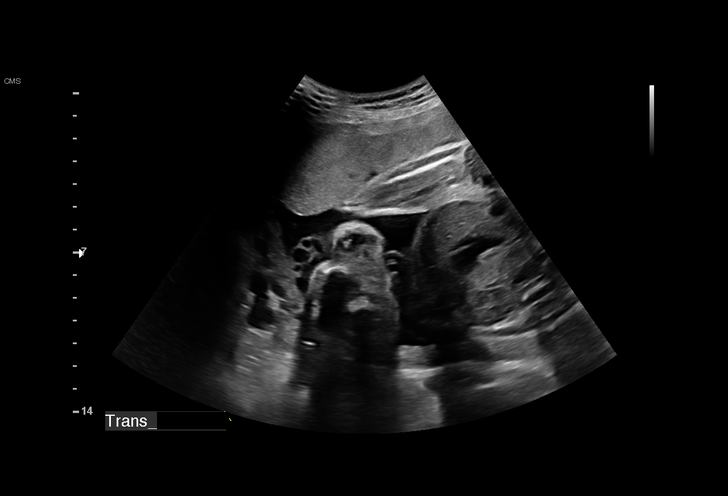
[im 22/83]
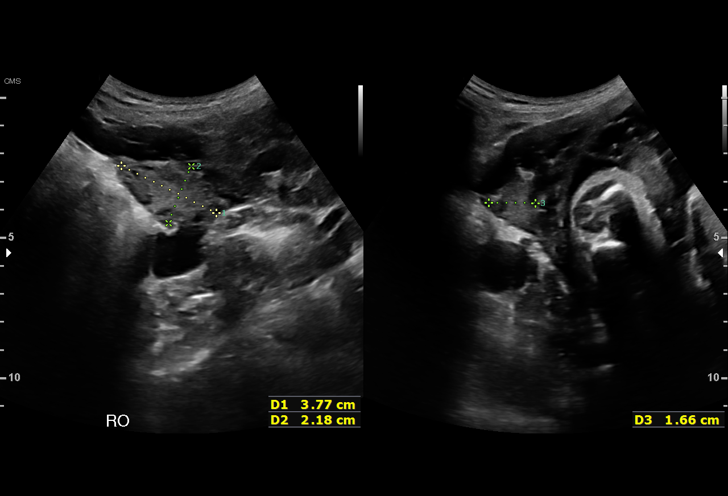
[im 28/83]
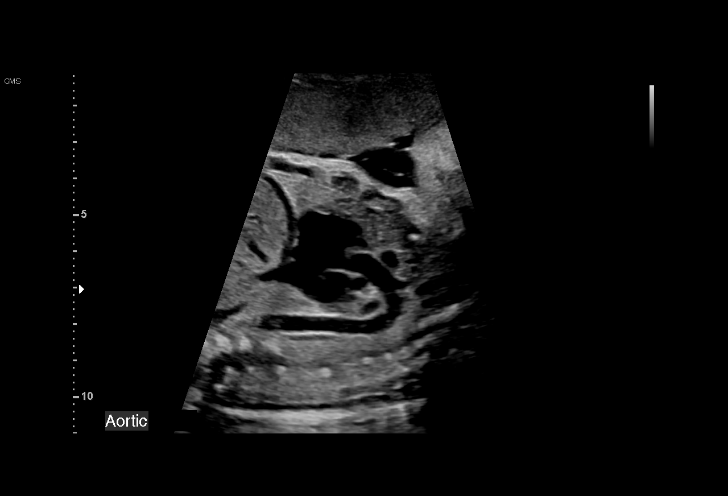
[im 34/83]
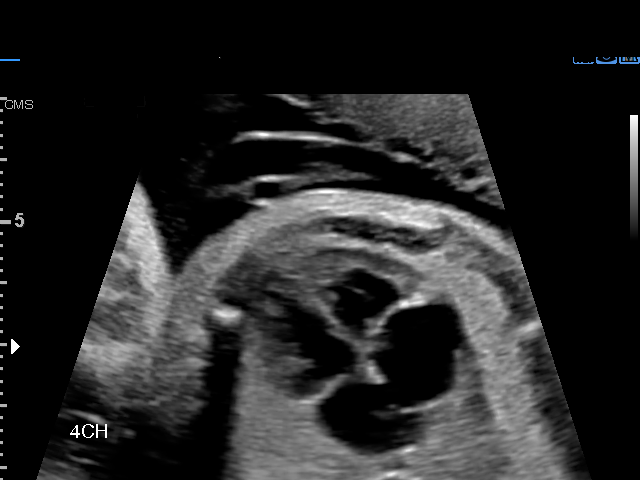
[im 43/83]
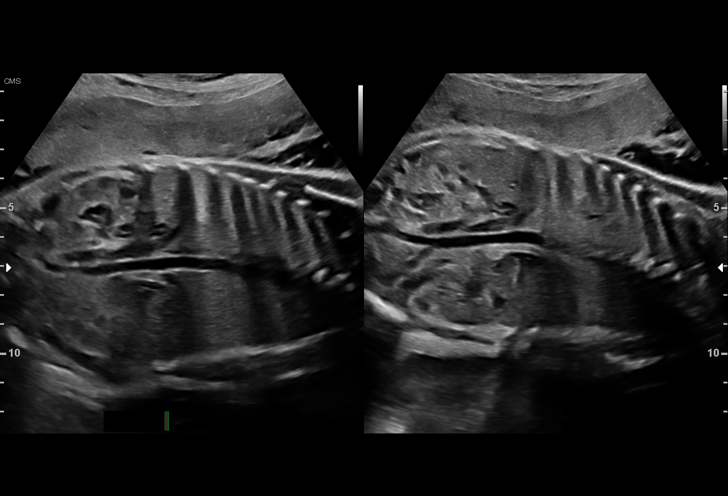
[im 49/83]
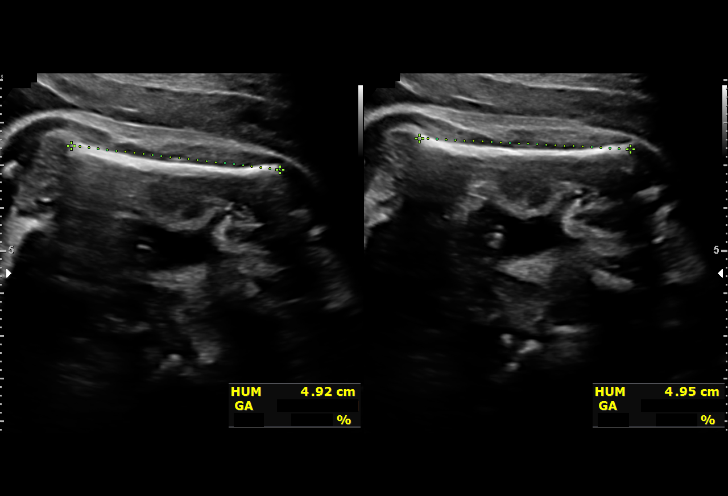
[im 55/83]
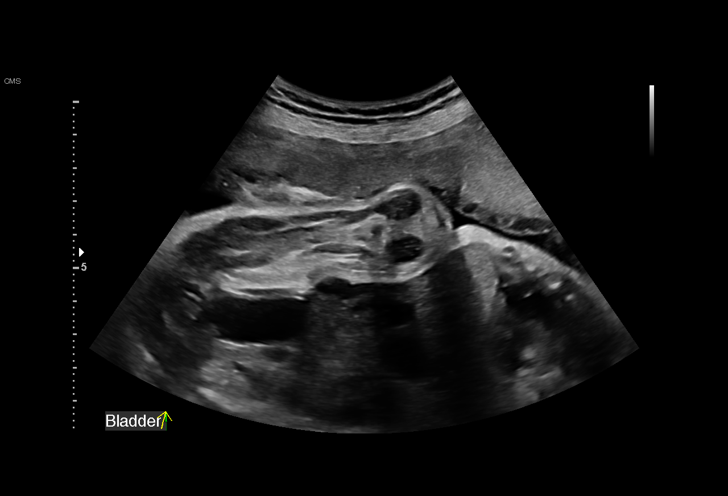
[im 61/83]
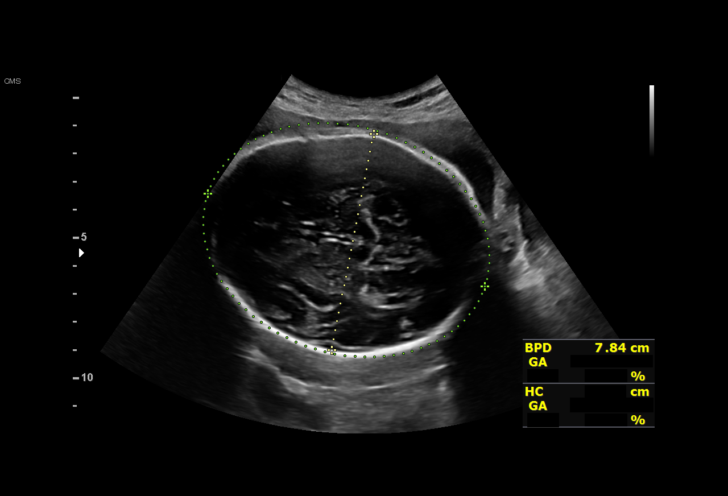
[im 67/83]
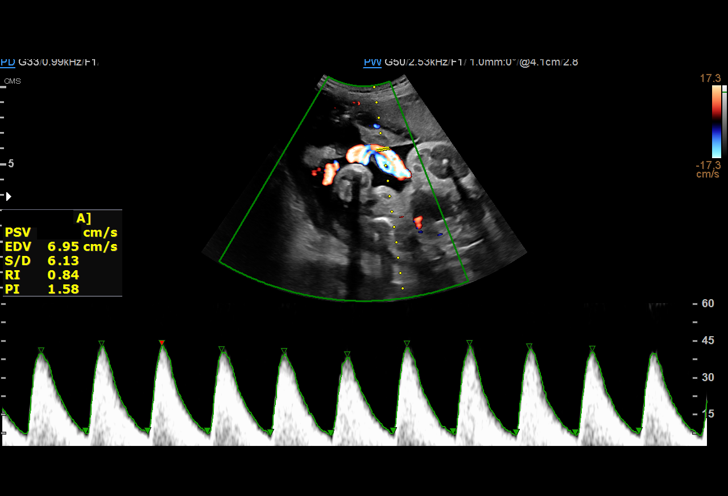
[im 73/83]
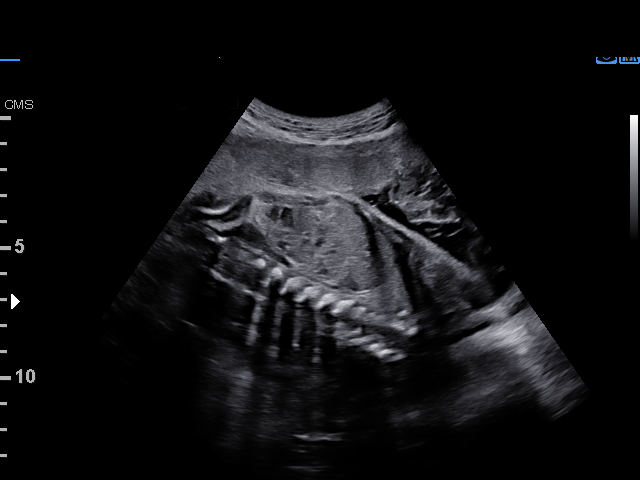
[im 79/83]
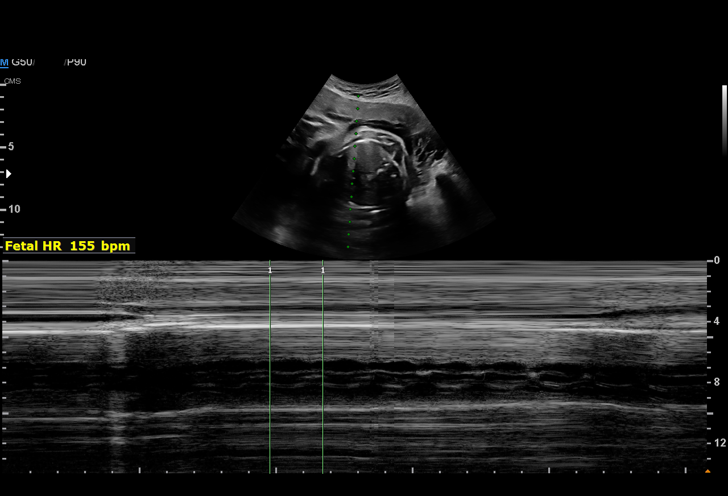

[13 of 28 positions shown; findings below may reference images not displayed]

OBGYN
                                                            [REDACTED]
                   KALAVA

Indications

 31 weeks gestation of pregnancy
 Abnormal fetal ultrasound (? Amniotic band     [9K]
 on ED study)
 Encounter for other antenatal screening        [9K]
 follow-up
 Previous cervical surgery (LEEP)               [9K]
 LR NIPS
 Small for gestational age fetus affecting      [9K]
 management of mother
Fetal Evaluation

 Num Of Fetuses:         1
 Fetal Heart Rate(bpm):  155
 Cardiac Activity:       Observed
 Presentation:           Cephalic
 Placenta:               Anterior
 P. Cord Insertion:      Previously Visualized
 Amniotic Fluid
 AFI FV:      Within normal limits

 AFI Sum(cm)     %Tile       Largest Pocket(cm)
 15.7            56

 RUQ(cm)       RLQ(cm)       LUQ(cm)        LLQ(cm)

Biophysical Evaluation

 Amniotic F.V:   Within normal limits       F. Tone:        Observed
 F. Movement:    Observed                   Score:          [DATE]
 F. Breathing:   Observed
Biometry

 BPD:      78.6  mm     G. Age:  31w 4d         47  %    CI:        73.45   %    70 - 86
                                                         FL/HC:      19.6   %    19.3 -
 HC:      291.4  mm     G. Age:  32w 1d         35  %    HC/AC:      1.18        0.96 -
 AC:      247.5  mm     G. Age:  29w 0d        2.8  %    FL/BPD:     72.6   %    71 - 87
 FL:       57.1  mm     G. Age:  30w 0d          9  %    FL/AC:      23.1   %    20 - 24
 HUM:      49.4  mm     G. Age:  29w 0d        < 5  %
 LV:        3.3  mm

 Est. FW:    [9K]  gm      3 lb 3 oz      6  %
OB History

 Blood Type:   A+
 Gravidity:    6         Term:   3        Prem:   0        SAB:   2
 TOP:          0       Ectopic:  0        Living: 3
Gestational Age

 LMP:           37w 5d        Date:  [DATE]                 EDD:   [DATE]
 Clinical EDD:  31w 2d                                        EDD:   [DATE]
 U/S Today:     30w 5d                                        EDD:   [DATE]
 Best:          31w 2d     Det. By:  Clinical EDD             EDD:   [DATE]
Anatomy

 Cranium:               Appears normal         Aortic Arch:            Appears normal
 Cavum:                 Appears normal         Ductal Arch:            Appears normal
 Ventricles:            Appears normal         Diaphragm:              Appears normal
 Choroid Plexus:        Appears normal         Stomach:                Appears normal, left
                                                                       sided
 Cerebellum:            Previously seen        Abdomen:                Appears normal
 Posterior Fossa:       Previously seen        Abdominal Wall:         Previously seen
 Nuchal Fold:           Previously seen        Cord Vessels:           Previously seen
 Face:                  Appears normal         Kidneys:                Appear normal
                        (orbits and profile)
 Lips:                  Appears normal         Bladder:                Appears normal
 Thoracic:              Appears normal         Spine:                  Previously seen
 Heart:                 Appears normal         Upper Extremities:      Previously seen
                        (4CH, axis, and
                        situs)
 RVOT:                  Appears normal         Lower Extremities:      Previously seen
 LVOT:                  Appears normal
 Other:  Female gender previously seen. Nasal bone and lenses visualized.
         VC, 3VV and 3VTV visualized. Heels/feet and open hands/5th digits
         previously visualized.
Doppler - Fetal Vessels

 Umbilical Artery
  S/D     %tile      RI    %tile      PI    %tile     PSV    ADFV    RDFV
                                                    (cm/s)
  4.95   > 97.5     0.8   > 97.5    1.37   > 97.5    23.46      No      No

Cervix Uterus Adnexa

 Cervix
 Not visualized (advanced GA >[9K])

 Uterus
 No abnormality visualized.

 Right Ovary
 Within normal limits.

 Left Ovary
 Within normal limits.

 Cul De Sac
 No free fluid seen.

 Adnexa
 No abnormality visualized.
Impression

 Patient returned for fetal growth assessment.  Uterine
 synechiae was ruled out on previous scans.
 She does not have gestational diabetes.  Blood pressure
 today at her office is 118/67 mmHg.
 On today's ultrasound, the estimated fetal weight is at the 6th
 percentile.  Abdominal circumference measurement is at the
 3rd percentile.  Amniotic fluid is normal and good fetal activity
 seen.  Umbilical artery Doppler showed increased S/D ratio
 (with intermittent normal forward diastolic flow).  BPP [DATE].
 Patient reports that her previous children weighed 6 pounds/6
 pounds / 5 pounds+ at birth.
 I explained the finding of fetal growth restriction that is difficult
 to differentiate from a constitutionally small fetus.  I explained
 abnormal Doppler studies in some evaluations.  I discussed
 ultrasound protocol for monitoring fetal growth restriction.
 Patient reports she will be relocating to [place] in the next
 few weeks.
Recommendations

 -An appointment was made for her to return next week for
 BPP and UA Doppler studies.
 -Weekly BPP and UA Doppler till delivery.
 -Fetal growth assessment in 3 weeks.
                 KALAVA

## 2020-10-07 ENCOUNTER — Other Ambulatory Visit: Payer: Self-pay

## 2020-10-07 ENCOUNTER — Ambulatory Visit: Payer: Medicaid Other | Attending: Obstetrics and Gynecology

## 2020-10-07 ENCOUNTER — Encounter: Payer: Self-pay | Admitting: *Deleted

## 2020-10-07 ENCOUNTER — Ambulatory Visit: Payer: Medicaid Other | Admitting: *Deleted

## 2020-10-07 VITALS — BP 102/60 | HR 73

## 2020-10-07 DIAGNOSIS — N879 Dysplasia of cervix uteri, unspecified: Secondary | ICD-10-CM | POA: Diagnosis not present

## 2020-10-07 DIAGNOSIS — O365930 Maternal care for other known or suspected poor fetal growth, third trimester, not applicable or unspecified: Secondary | ICD-10-CM | POA: Insufficient documentation

## 2020-10-07 DIAGNOSIS — Z369 Encounter for antenatal screening, unspecified: Secondary | ICD-10-CM | POA: Diagnosis not present

## 2020-10-07 DIAGNOSIS — O365990 Maternal care for other known or suspected poor fetal growth, unspecified trimester, not applicable or unspecified: Secondary | ICD-10-CM

## 2020-10-07 DIAGNOSIS — O3443 Maternal care for other abnormalities of cervix, third trimester: Secondary | ICD-10-CM | POA: Diagnosis not present

## 2020-10-07 DIAGNOSIS — Z3A32 32 weeks gestation of pregnancy: Secondary | ICD-10-CM | POA: Diagnosis not present

## 2020-10-07 DIAGNOSIS — O289 Unspecified abnormal findings on antenatal screening of mother: Secondary | ICD-10-CM

## 2020-10-07 IMAGING — US US MFM FETAL BPP W/O NON-STRESS
1 series · 14 of 28 positions shown · non-contrast
Comparison: none

[Series 1: us mfm fetal bpp w/o non-stress · 52 acquisitions, 14 frames shown]
[im 2/52]
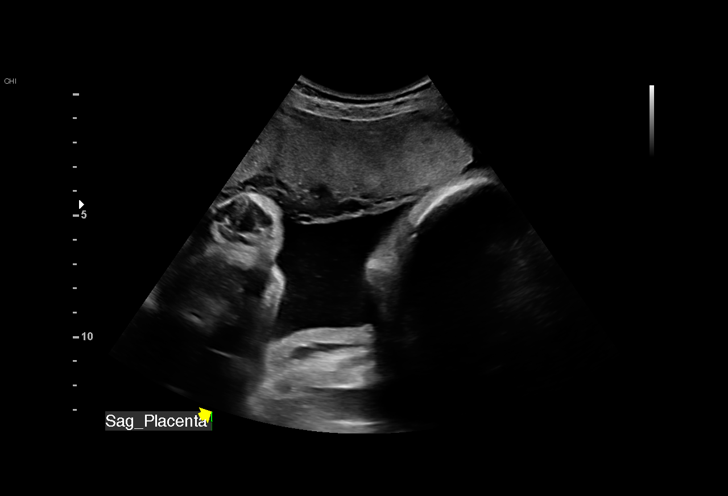
[im 6/52]
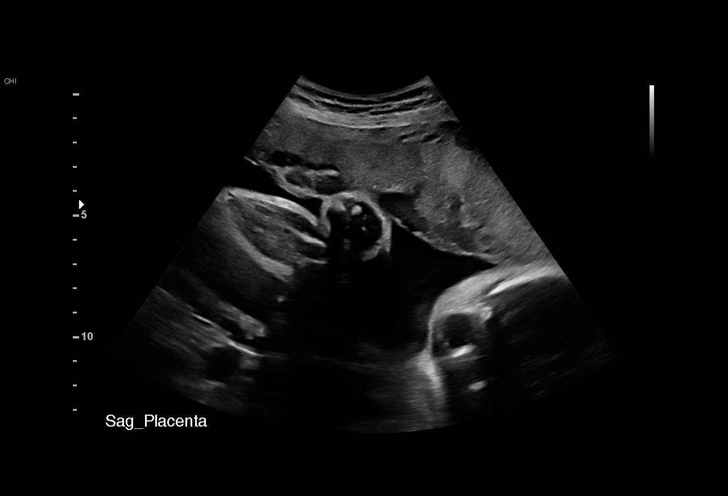
[im 10/52]
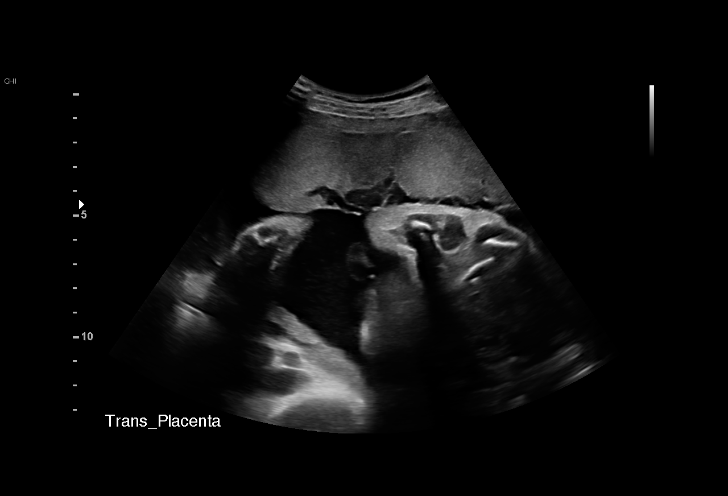
[im 14/52]
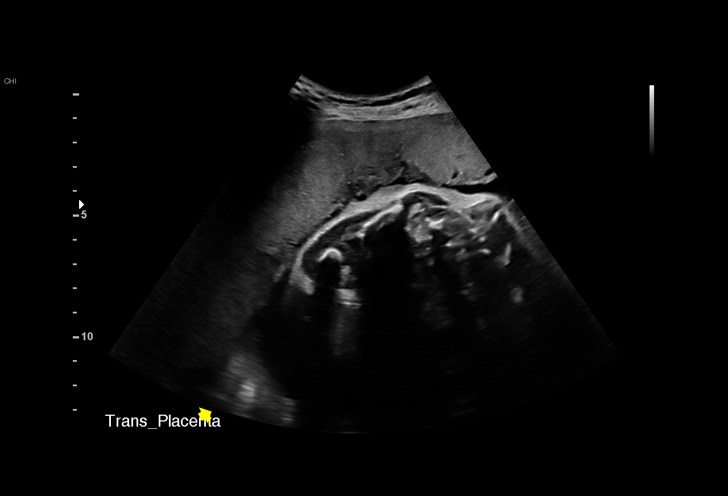
[im 18/52]
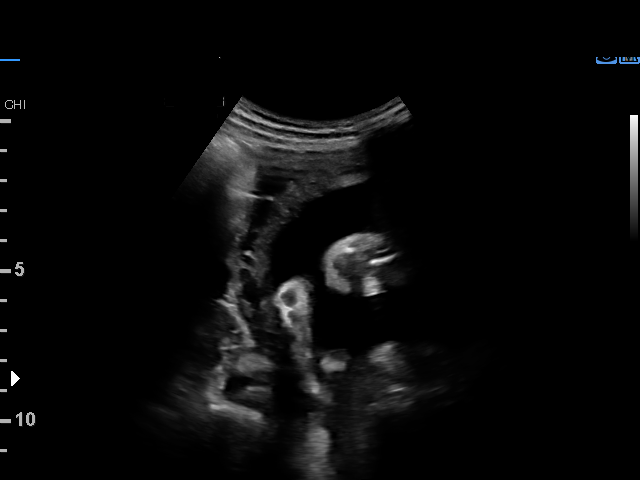
[im 21/52]
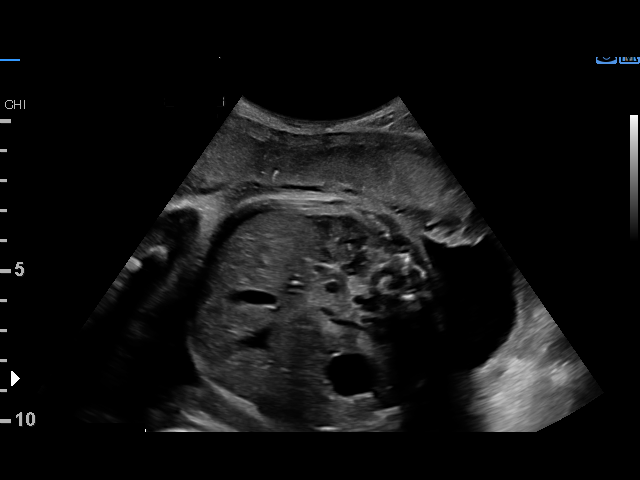
[im 25/52]
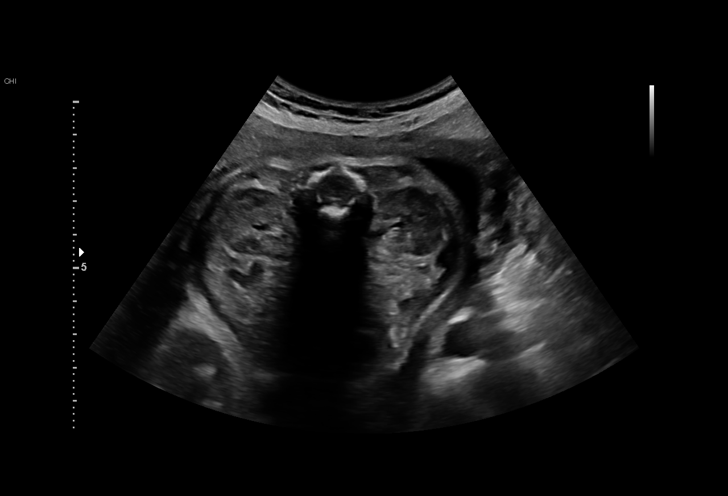
[im 29/52]
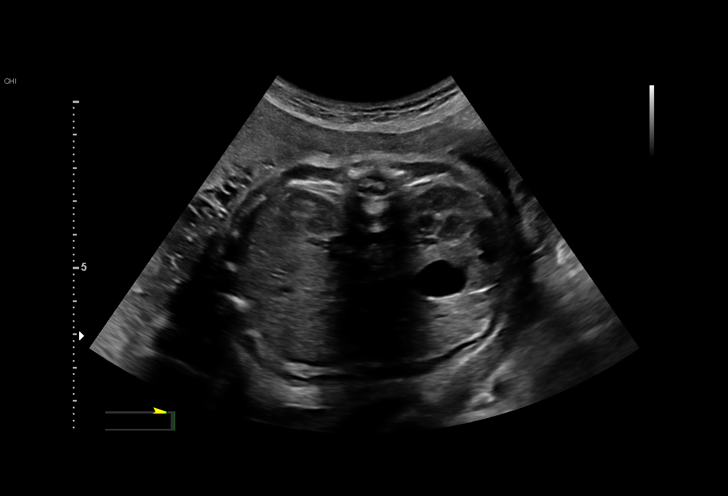
[im 33/52]
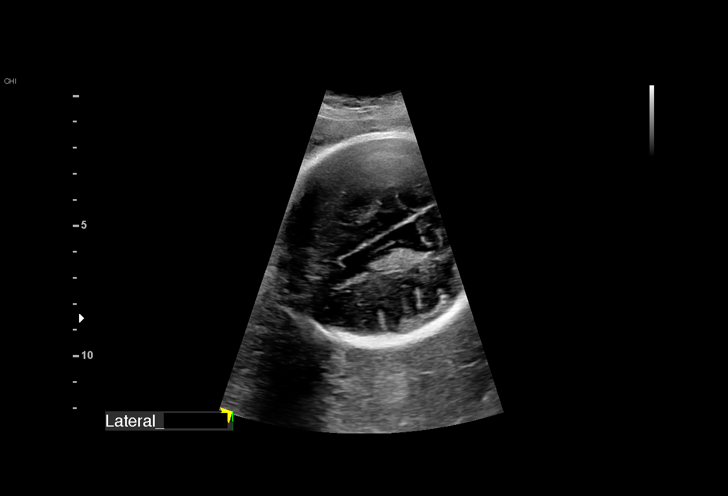
[im 36/52]
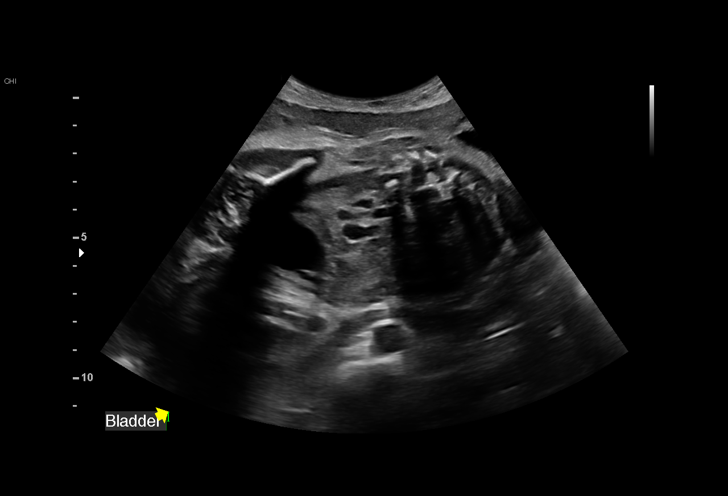
[im 40/52]
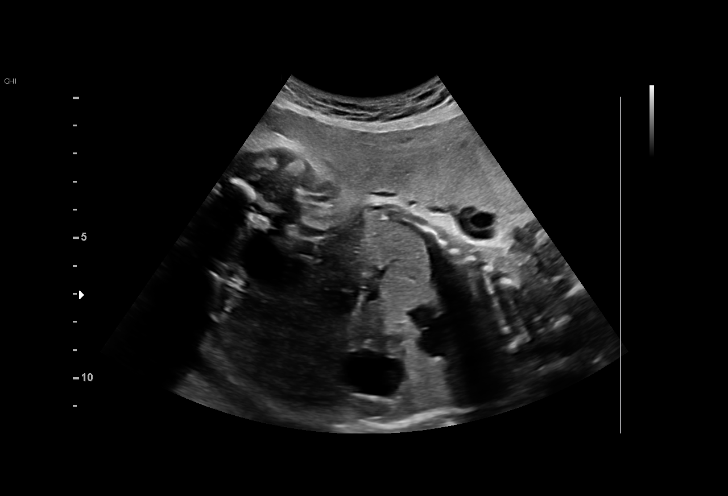
[im 44/52]
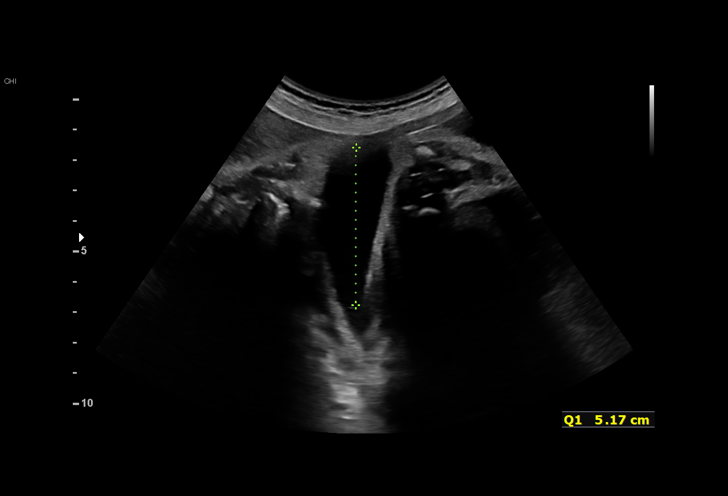
[im 48/52]
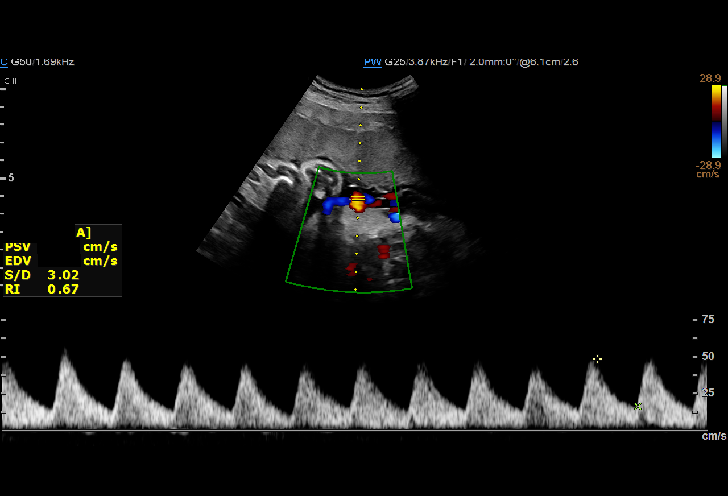
[im 52/52]
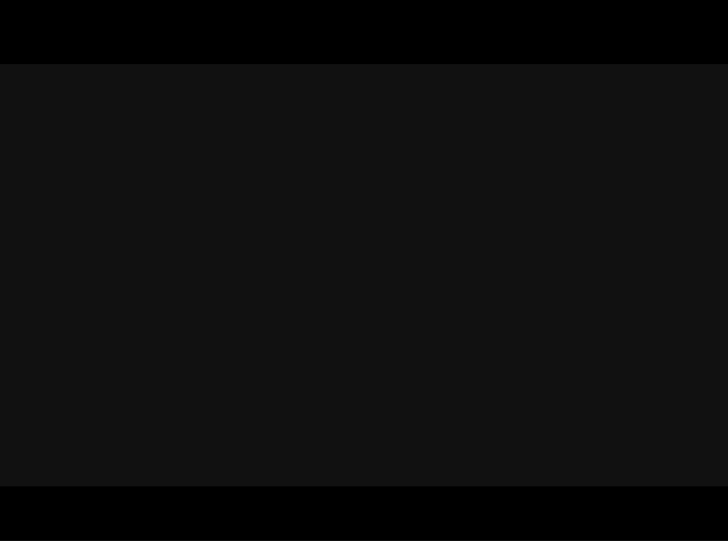

[14 of 28 positions shown; findings below may reference images not displayed]

OBGYN
                                                            [REDACTED]
                   RMADRID

Indications

 Small for gestational age fetus affecting      [GS]
 management of mother
 Abnormal fetal ultrasound (? Amniotic band     [GS]
 on ED study)
 Previous cervical surgery (LEEP)               [GS]
 LR NIPS
 32 weeks gestation of pregnancy
Fetal Evaluation

 Num Of Fetuses:         1
 Fetal Heart Rate(bpm):  157
 Cardiac Activity:       Observed
 Presentation:           Cephalic
 Placenta:               Anterior
 P. Cord Insertion:      Previously Visualized

 Amniotic Fluid
 AFI FV:      Within normal limits

 AFI Sum(cm)     %Tile       Largest Pocket(cm)
 13.78           45

 RUQ(cm)       RLQ(cm)       LUQ(cm)        LLQ(cm)

Biophysical Evaluation

 Amniotic F.V:   Pocket => 2 cm             F. Tone:        Observed
 F. Movement:    Observed                   Score:          [DATE]
 F. Breathing:   Observed
OB History

 Blood Type:   A+
 Gravidity:    6         Term:   3        Prem:   0        SAB:   2
 TOP:          0       Ectopic:  0        Living: 3
Gestational Age

 LMP:           38w 5d        Date:  [DATE]                 EDD:   [DATE]
 Clinical EDD:  32w 2d                                        EDD:   [DATE]
 Best:          32w 2d     Det. By:  Clinical EDD             EDD:   [DATE]
Anatomy

 Cranium:               Appears normal         Diaphragm:              Appears normal
 Cavum:                 Appears normal         Stomach:                Appears normal, left
                                                                       sided
 Ventricles:            Appears normal         Kidneys:                Appear normal
 Thoracic:              Appears normal         Bladder:                Appears normal
 Heart:                 Appears normal
                        (4CH, axis, and
                        situs)
Doppler - Fetal Vessels

 Umbilical Artery
  S/D     %tile      RI    %tile      PI    %tile     PSV    ADFV    RDFV
                                                    (cm/s)
  3.24       80    0.69       82    1.[REDACTED]      No      No

Cervix Uterus Adnexa

 Cervix
 Not visualized (advanced GA >[GS])
Impression

 Fetal growth restriction.  Patient returned for antenatal testing.

 Amniotic fluid is normal and good fetal activity is seen
 .Antenatal testing is reassuring. BPP [DATE]. Umbilical artery
 Doppler showed normal forward diastolic flow .

 We reassured the patient of the findings.
Recommendations

 Continue weekly BPP and UA Doppler till delivery.
                 RMADRID

## 2020-10-14 ENCOUNTER — Ambulatory Visit: Payer: Medicaid Other

## 2020-10-14 ENCOUNTER — Other Ambulatory Visit: Payer: Self-pay | Admitting: Obstetrics and Gynecology

## 2020-10-14 DIAGNOSIS — O365930 Maternal care for other known or suspected poor fetal growth, third trimester, not applicable or unspecified: Secondary | ICD-10-CM

## 2020-10-14 DIAGNOSIS — Z3A33 33 weeks gestation of pregnancy: Secondary | ICD-10-CM

## 2020-10-15 ENCOUNTER — Other Ambulatory Visit: Payer: Self-pay

## 2020-10-15 ENCOUNTER — Encounter: Payer: Self-pay | Admitting: *Deleted

## 2020-10-15 ENCOUNTER — Other Ambulatory Visit: Payer: Self-pay | Admitting: *Deleted

## 2020-10-15 ENCOUNTER — Ambulatory Visit: Payer: Medicaid Other | Admitting: *Deleted

## 2020-10-15 ENCOUNTER — Ambulatory Visit: Payer: Medicaid Other | Attending: Obstetrics and Gynecology

## 2020-10-15 DIAGNOSIS — N879 Dysplasia of cervix uteri, unspecified: Secondary | ICD-10-CM | POA: Diagnosis not present

## 2020-10-15 DIAGNOSIS — O3443 Maternal care for other abnormalities of cervix, third trimester: Secondary | ICD-10-CM | POA: Diagnosis not present

## 2020-10-15 DIAGNOSIS — O365930 Maternal care for other known or suspected poor fetal growth, third trimester, not applicable or unspecified: Secondary | ICD-10-CM | POA: Diagnosis not present

## 2020-10-15 DIAGNOSIS — O289 Unspecified abnormal findings on antenatal screening of mother: Secondary | ICD-10-CM | POA: Diagnosis not present

## 2020-10-15 DIAGNOSIS — Z3A33 33 weeks gestation of pregnancy: Secondary | ICD-10-CM | POA: Diagnosis not present

## 2020-10-15 IMAGING — US US MFM FETAL BPP W/O NON-STRESS
1 series · 14 of 28 positions shown · non-contrast
Comparison: none

[Series 1: us mfm fetal bpp w/o non-stress · 37 acquisitions, 14 frames shown]
[im 2/37]
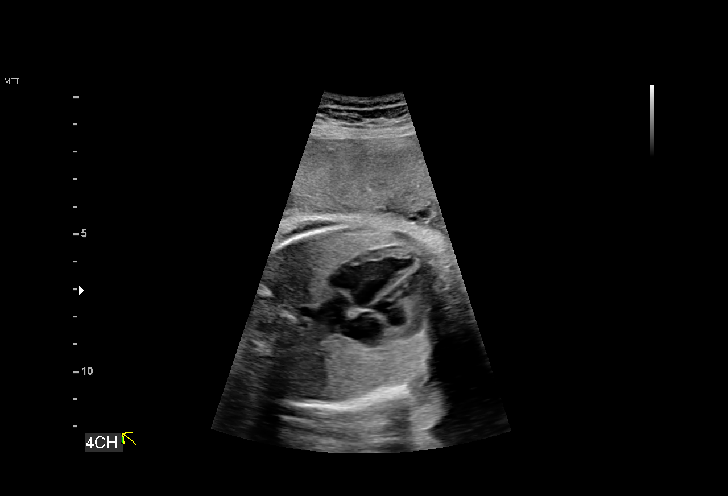
[im 5/37]
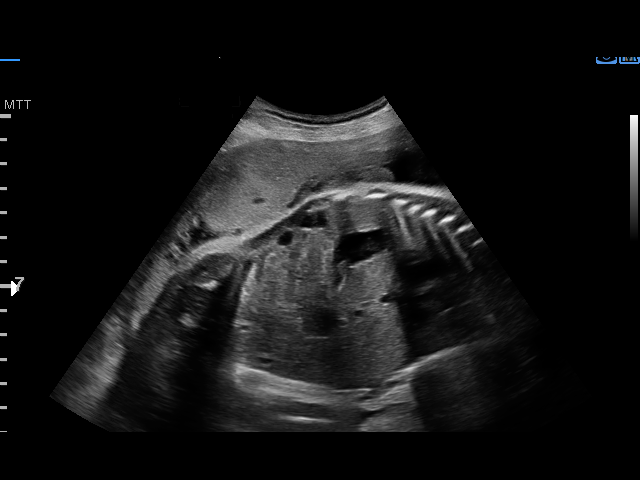
[im 7/37]
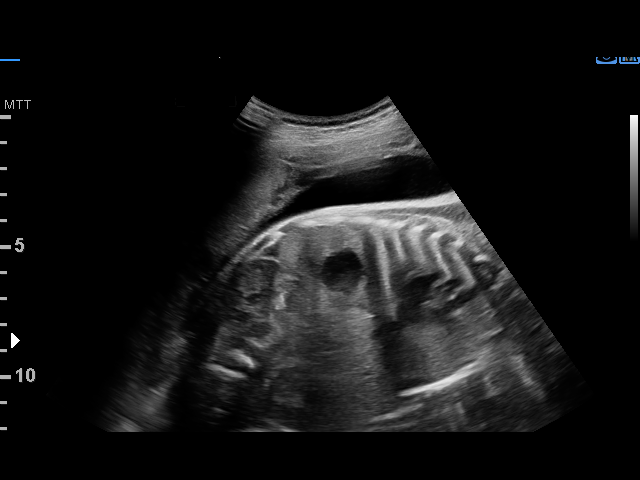
[im 10/37]
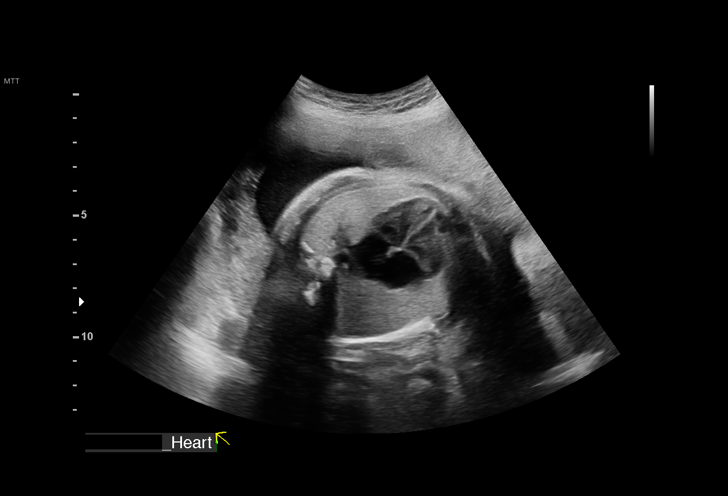
[im 13/37]
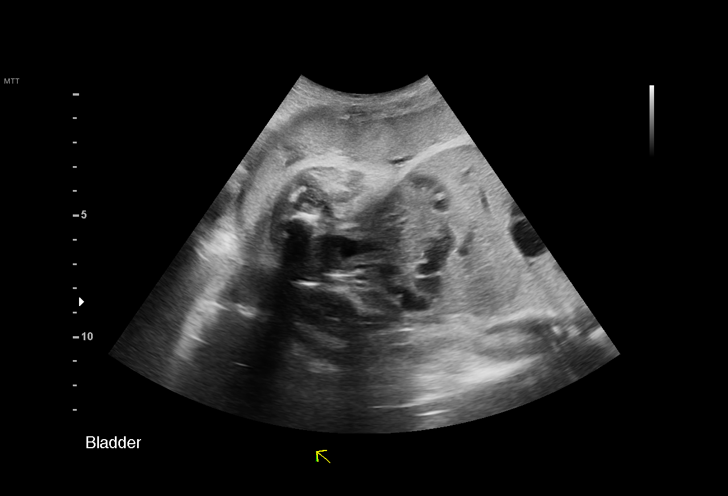
[im 15/37]
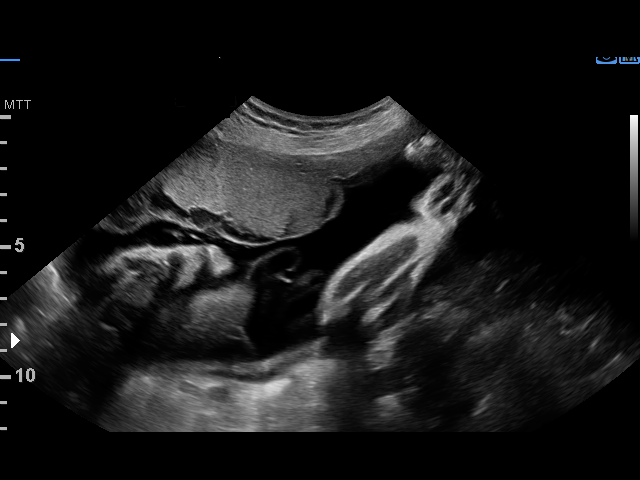
[im 18/37]
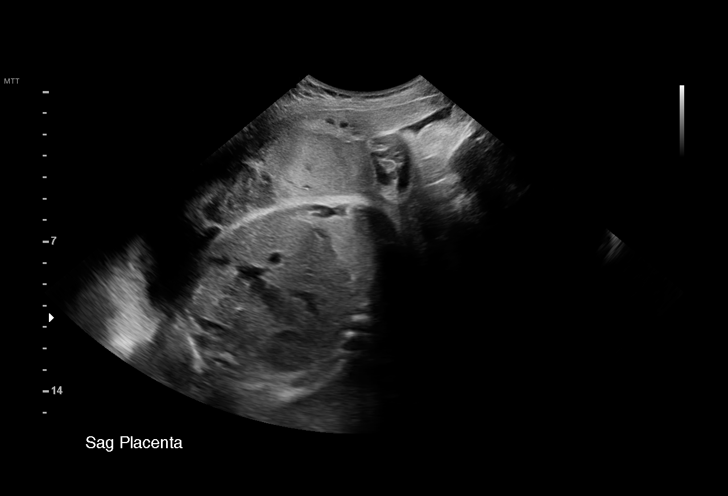
[im 21/37]
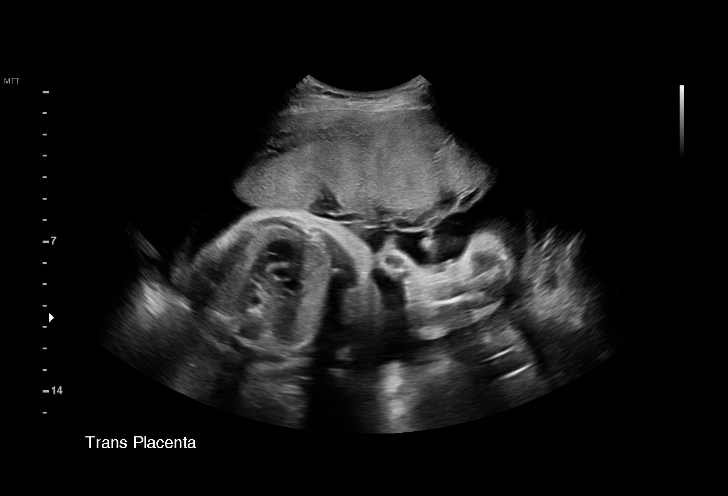
[im 23/37]
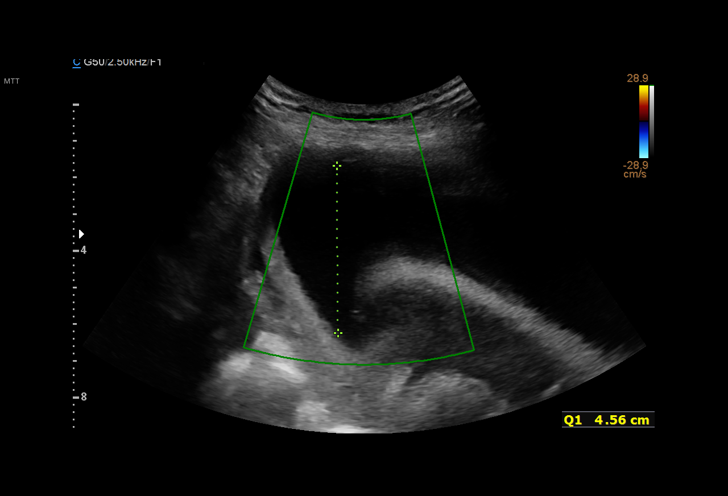
[im 26/37]
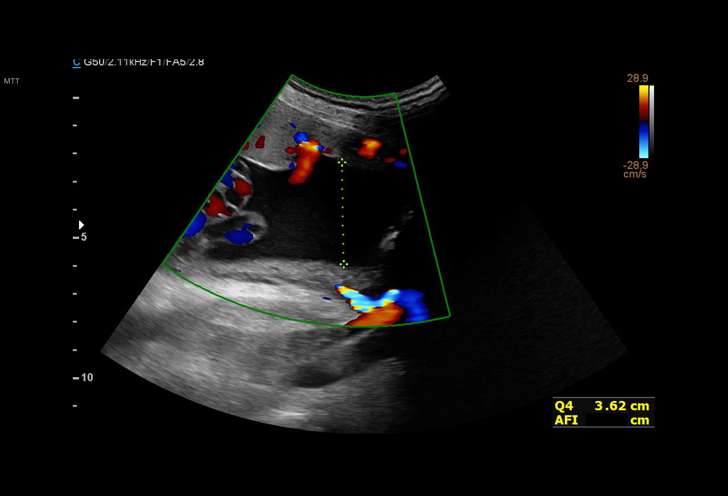
[im 29/37]
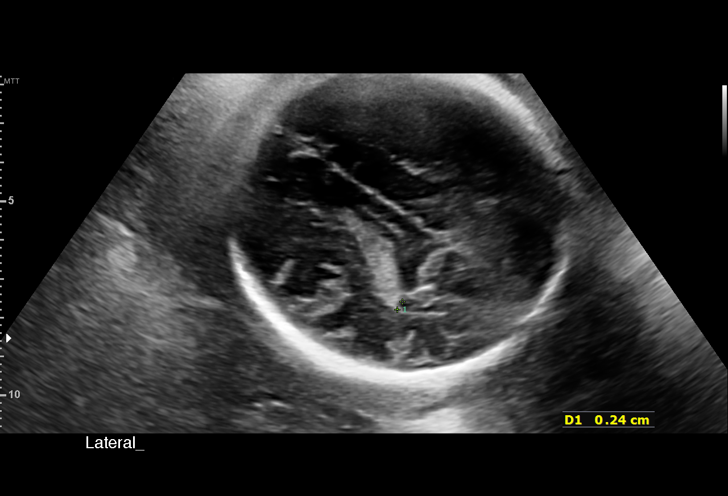
[im 31/37]
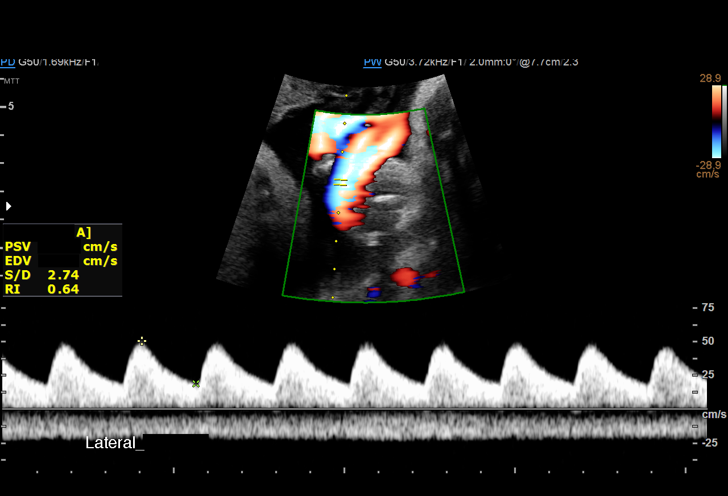
[im 34/37]
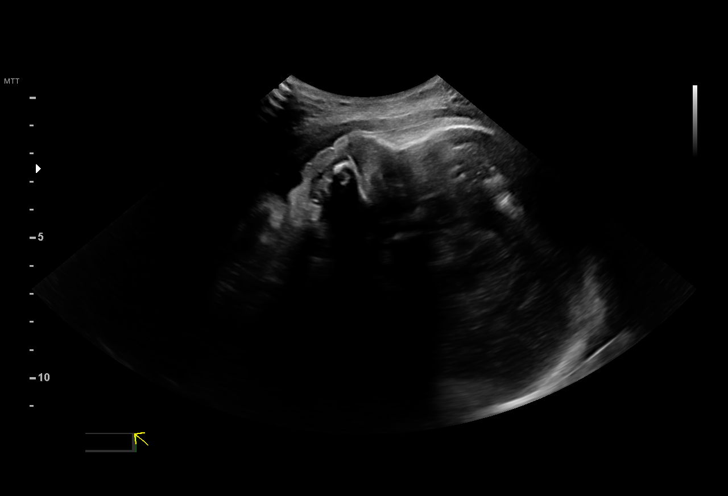
[im 37/37]
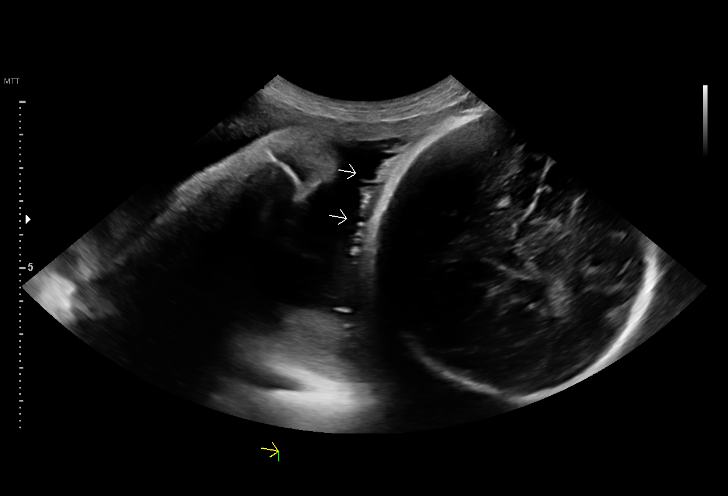

[14 of 28 positions shown; findings below may reference images not displayed]

OBGYN
                                                            [REDACTED]
                   ZEGAR

Indications

 33 weeks gestation of pregnancy
 Small for gestational age fetus affecting      [EH]
 management of mother
 Abnormal fetal ultrasound (? Amniotic band     [EH]
 on ED study)
 Previous cervical surgery (LEEP)               [EH]
 LR NIPS
Fetal Evaluation

 Num Of Fetuses:         1
 Fetal Heart Rate(bpm):  122
 Cardiac Activity:       Observed
 Presentation:           Cephalic
 Placenta:               Anterior
 P. Cord Insertion:      Previously Visualized

 Amniotic Fluid
 AFI FV:      Within normal limits

 AFI Sum(cm)     %Tile       Largest Pocket(cm)
 15.7            56

 RUQ(cm)       RLQ(cm)       LUQ(cm)        LLQ(cm)

Biophysical Evaluation

 Amniotic F.V:   Pocket => 2 cm             F. Tone:        Observed
 F. Movement:    Observed                   Score:          [DATE]
 F. Breathing:   Observed
OB History

 Blood Type:   A+
 Gravidity:    6         Term:   3        Prem:   0        SAB:   2
 TOP:          0       Ectopic:  0        Living: 3
Gestational Age

 LMP:           39w 6d        Date:  [DATE]                 EDD:   [DATE]
 Clinical EDD:  33w 3d                                        EDD:   [DATE]
 Best:          33w 3d     Det. By:  Clinical EDD             EDD:   [DATE]
Anatomy

 Ventricles:            Appears normal         Stomach:                Appears normal, left
                                                                       sided
 Thoracic:              Appears normal         Kidneys:                Appear normal
 Heart:                 Appears normal         Bladder:                Appears normal
                        (4CH, axis, and
                        situs)
 Diaphragm:             Appears normal
Doppler - Fetal Vessels

 Umbilical Artery
  S/D     %tile      RI    %tile                             ADFV    RDFV
  2.81       64    0.64       68                                No      No

Cervix Uterus Adnexa

 Cervix
 Not visualized (advanced GA >[EH])

 Uterus
 No abnormality visualized.

 Right Ovary
 Within normal limits.

 Left Ovary
 Within normal limits.

 Cul De Sac
 No free fluid seen.

 Adnexa
 No abnormality visualized.
Impression

 Fetal growth restriction.  Patient return for antenatal testing.
 Amniotic fluid is normal and good fetal activity is seen
 .Antenatal testing is reassuring. BPP [DATE]. Umbilical artery
 Doppler showed normal forward diastolic flow .
 I reassured the patient of the findings.
Recommendations

 - Fetal growth assessment next week.
 -Patient will be relocating to [place] this month and will be
 delivering there.
                 ZEGAR

## 2020-10-20 ENCOUNTER — Encounter: Payer: Self-pay | Admitting: *Deleted

## 2020-10-20 ENCOUNTER — Ambulatory Visit: Payer: Medicaid Other | Attending: Obstetrics and Gynecology

## 2020-10-20 ENCOUNTER — Ambulatory Visit: Payer: Medicaid Other | Admitting: *Deleted

## 2020-10-20 ENCOUNTER — Other Ambulatory Visit: Payer: Self-pay

## 2020-10-20 ENCOUNTER — Other Ambulatory Visit: Payer: Self-pay | Admitting: *Deleted

## 2020-10-20 VITALS — BP 112/62 | HR 69

## 2020-10-20 DIAGNOSIS — O365990 Maternal care for other known or suspected poor fetal growth, unspecified trimester, not applicable or unspecified: Secondary | ICD-10-CM

## 2020-10-20 DIAGNOSIS — N879 Dysplasia of cervix uteri, unspecified: Secondary | ICD-10-CM

## 2020-10-20 DIAGNOSIS — O365930 Maternal care for other known or suspected poor fetal growth, third trimester, not applicable or unspecified: Secondary | ICD-10-CM | POA: Diagnosis not present

## 2020-10-20 DIAGNOSIS — O289 Unspecified abnormal findings on antenatal screening of mother: Secondary | ICD-10-CM | POA: Diagnosis not present

## 2020-10-20 DIAGNOSIS — O3443 Maternal care for other abnormalities of cervix, third trimester: Secondary | ICD-10-CM

## 2020-10-20 DIAGNOSIS — Z3A34 34 weeks gestation of pregnancy: Secondary | ICD-10-CM | POA: Insufficient documentation

## 2020-10-20 DIAGNOSIS — O365931 Maternal care for other known or suspected poor fetal growth, third trimester, fetus 1: Secondary | ICD-10-CM

## 2020-10-20 IMAGING — US US MFM FETAL BPP W/O NON-STRESS
1 series · 11 of 28 positions shown · non-contrast
Comparison: none

[Series 1: us mfm fetal bpp w/o non-stress · 11 of 68 slices shown]
[im 3/68]
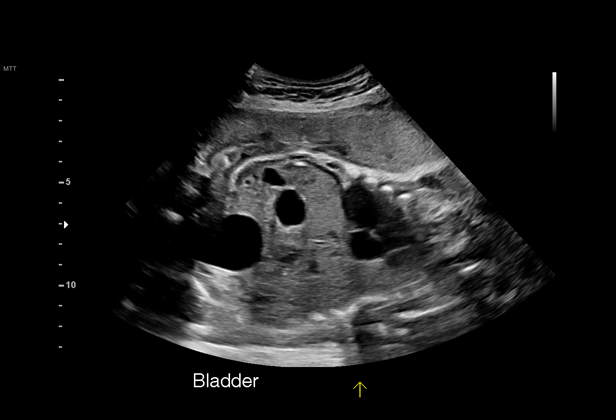
[im 8/68]
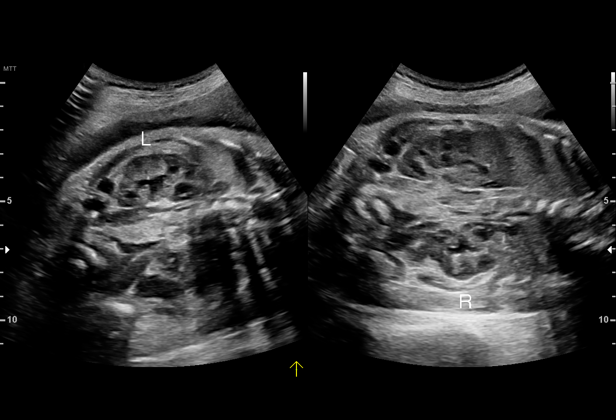
[im 15/68]
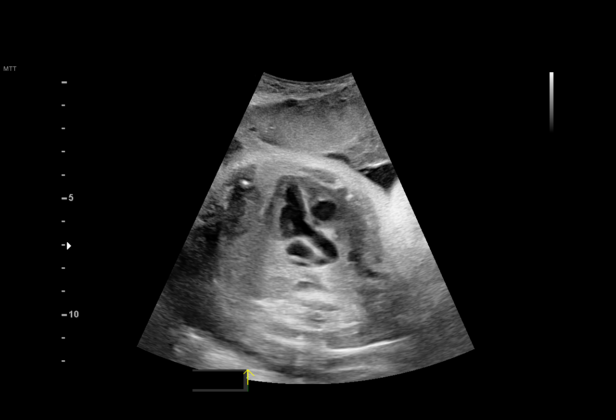
[im 20/68]
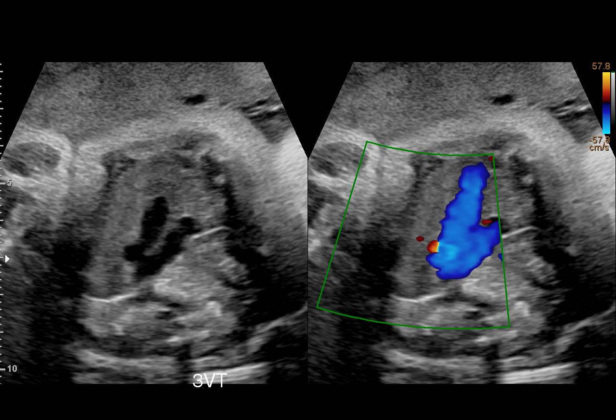
[im 28/68]
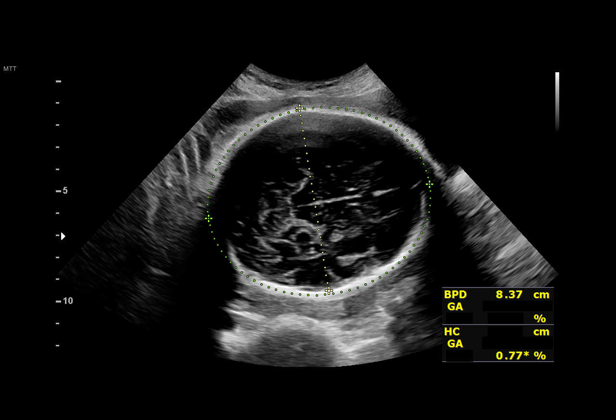
[im 35/68]
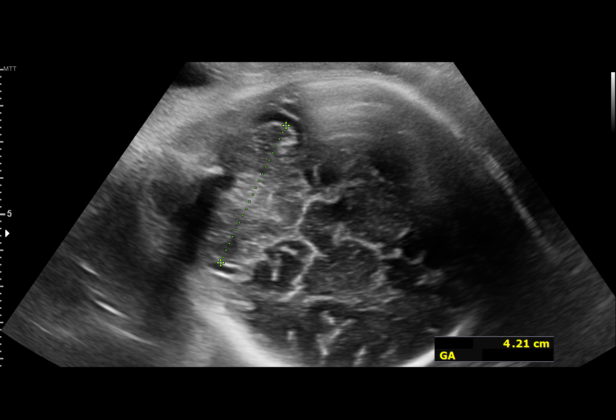
[im 40/68]
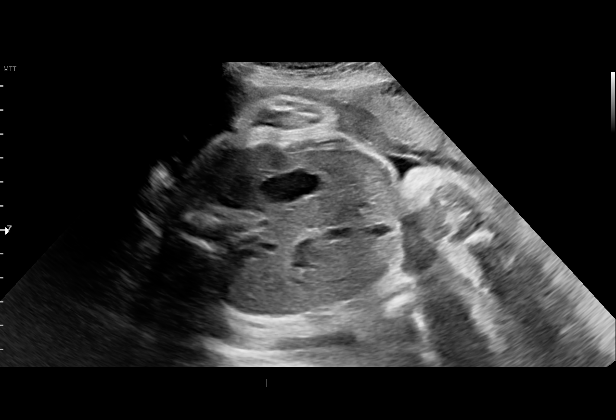
[im 48/68]
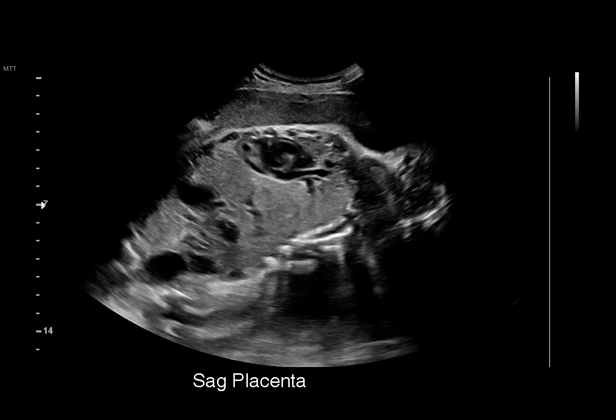
[im 53/68]
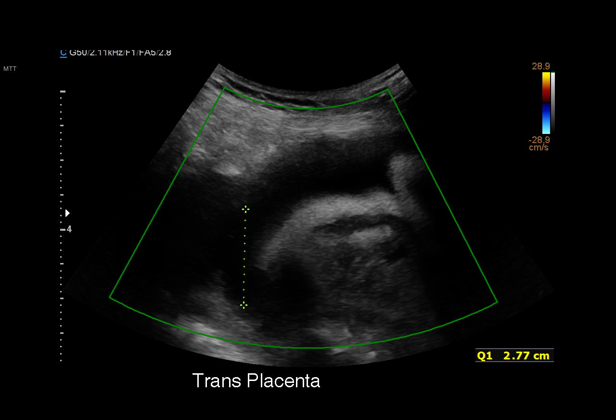
[im 60/68]
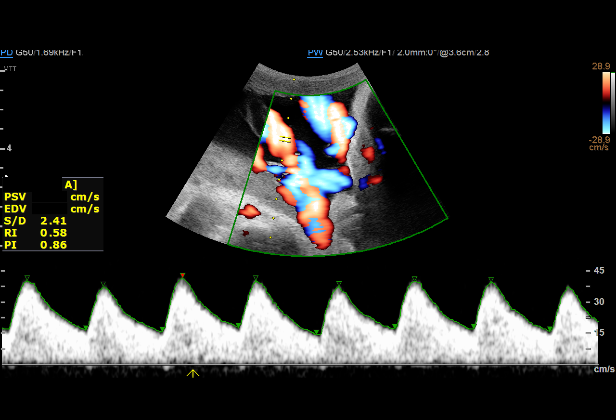
[im 65/68]
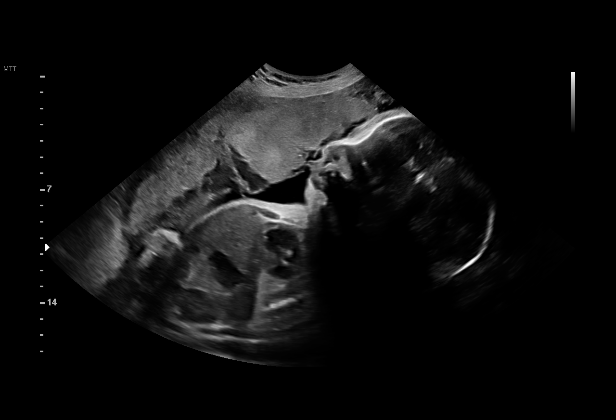

[11 of 28 positions shown; findings below may reference images not displayed]

Addendum:\.br----------------------------------------------------------------------
----------------------------------------------------------------------

----------------------------------------------------------------------

                                                            OBGYN
                                                            [REDACTED]
                   NIEBLES
----------------------------------------------------------------------

----------------------------------------------------------------------

----------------------------------------------------------------------
Indications

 34 weeks gestation of pregnancy
 Small for gestational age fetus affecting      [BZ]
 management of mother
 Abnormal fetal ultrasound (? Amniotic band     [BZ]
 on ED study)
 Previous cervical surgery (LEEP)               [BZ]
 LR NIPS
----------------------------------------------------------------------
Fetal Evaluation

 Num Of Fetuses:         1
 Fetal Heart Rate(bpm):  124
 Cardiac Activity:       Observed
 Presentation:           Cephalic
 Placenta:               Anterior
 P. Cord Insertion:      Previously Visualized

 Amniotic Fluid
 AFI FV:      Within normal limits

 AFI Sum(cm)     %Tile       Largest Pocket(cm)
 17              62
 RUQ(cm)       RLQ(cm)       LUQ(cm)        LLQ(cm)
 2.8           4
----------------------------------------------------------------------
Biophysical Evaluation

 Amniotic F.V:   Pocket => 2 cm             F. Tone:        Observed
 F. Movement:    Observed                   Score:          [DATE]
 F. Breathing:   Observed
----------------------------------------------------------------------
Biometry

 BPD:      83.4  mm     G. Age:  33w 4d         31  %    CI:        79.52   %    70 - 86
                                                         FL/HC:      20.7   %    19.4 -
 HC:      295.6  mm     G. Age:  32w 5d        1.9  %    HC/AC:      1.04        0.96 -
 AC:      284.3  mm     G. Age:  32w 3d         12  %    FL/BPD:     73.4   %    71 - 87
 FL:       61.2  mm     G. Age:  31w 5d        2.5  %    FL/AC:      21.5   %    20 - 24
 CER:      42.1  mm     G. Age:  33w 3d         18  %

 LV:        2.2  mm

 Est. FW:    [BZ]  gm      4 lb 5 oz      7  %
----------------------------------------------------------------------
OB History

 Blood Type:   A+
 Gravidity:    6         Term:   3        Prem:   0        SAB:   2
 TOP:          0       Ectopic:  0        Living: 3
----------------------------------------------------------------------
Gestational Age

 LMP:           40w 4d        Date:  [DATE]                 EDD:   [DATE]
 Clinical EDD:  34w 1d                                        EDD:   [DATE]
 U/S Today:     32w 4d                                        EDD:   [DATE]
 Best:          34w 1d     Det. By:  Clinical EDD             EDD:   [DATE]
----------------------------------------------------------------------
Anatomy

 Cranium:               Appears normal         Aortic Arch:            Previously seen
 Cavum:                 Appears normal         Ductal Arch:            Appears normal
 Ventricles:            Appears normal         Diaphragm:              Appears normal
 Choroid Plexus:        Previously seen        Stomach:                Appears normal, left
                                                                       sided
 Cerebellum:            Appears normal         Abdomen:                Previously seen
 Posterior Fossa:       Appears normal         Abdominal Wall:         Previously seen
 Nuchal Fold:           Previously seen        Cord Vessels:           Previously seen
 Face:                  Orbits and profile     Kidneys:                Appear normal
                        previously seen
 Lips:                  Previously seen        Bladder:                Appears normal
 Thoracic:              Appears normal         Spine:                  Previously seen
 Heart:                 Appears normal         Upper Extremities:      Previously seen
                        (4CH, axis, and
                        situs)
 RVOT:                  Appears normal         Lower Extremities:      Previously seen
 LVOT:                  Appears normal

 Other:  Female gender previously seen. Nasal bone, lenses, VC, 3VV and
         3VTV, Heels/feet and open hands/5th digits previously visualized.
         Technically difficult due to advanced GA and fetal position.
----------------------------------------------------------------------
Doppler - Fetal Vessels

 Umbilical Artery
  S/D     %tile      RI    %tile      PI    %tile     PSV    ADFV    RDFV
                                                    (cm/s)
  2.44       45    0.59       51    0.[REDACTED]      No      No

----------------------------------------------------------------------
Cervix Uterus Adnexa

 Cervix
 Not visualized (advanced GA >[BZ])

 Uterus
 No abnormality visualized.

 Right Ovary
 Within normal limits.

 Left Ovary
 Within normal limits.

 Cul De Sac
 No free fluid seen.

 Adnexa
 No abnormality visualized.
----------------------------------------------------------------------
Impression

 Follow up growth for FGR
 Today's measurements are again consistent with FGR EFW
 at th 7th%.
 The UA dopplers normal without evidence of AEDF or REDF
 Biophysical profile is [DATE] with good fetal movement and
 amniotic fluid.

 Ms. NIEBLES conveyed that she is moving to [place] at the end of
 the month. We have scheduled her for the next 2-3 appts.
----------------------------------------------------------------------
Recommendations

 Continue weekly testing with UA Dopplers
 Repeat growth in 4 weeks
 Consider delivery by 39 weeks unless antenatal testing is
 abnormal or the EFW is < 3rd%.
----------------------------------------------------------------------
----------------------------------------------------------------------

*** End of Addendum ***\.br----------------------------------------------------------------------
----------------------------------------------------------------------

----------------------------------------------------------------------

                                                            OBGYN
                                                            [REDACTED]
                   NIEBLES
----------------------------------------------------------------------

----------------------------------------------------------------------

----------------------------------------------------------------------
Indications

 34 weeks gestation of pregnancy
 Small for gestational age fetus affecting      [BZ]
 management of mother
 Abnormal fetal ultrasound (? Amniotic band     [BZ]
 on ED study)
 Previous cervical surgery (LEEP)               [BZ]
 LR NIPS
----------------------------------------------------------------------
Fetal Evaluation

 Num Of Fetuses:         1
 Fetal Heart Rate(bpm):  124
 Cardiac Activity:       Observed
 Presentation:           Cephalic
 Placenta:               Anterior
 P. Cord Insertion:      Previously Visualized

 Amniotic Fluid
 AFI FV:      Within normal limits

 AFI Sum(cm)     %Tile       Largest Pocket(cm)
 17              62
 RUQ(cm)       RLQ(cm)       LUQ(cm)        LLQ(cm)
 2.8           4
----------------------------------------------------------------------
Biophysical Evaluation

 Amniotic F.V:   Pocket => 2 cm             F. Tone:        Observed
 F. Movement:    Observed                   Score:          [DATE]
 F. Breathing:   Observed
----------------------------------------------------------------------
Biometry

 BPD:      83.4  mm     G. Age:  33w 4d         31  %    CI:        79.52   %    70 - 86
                                                         FL/HC:      20.7   %    19.4 -
 HC:      295.6  mm     G. Age:  32w 5d        1.9  %    HC/AC:      1.04        0.96 -
 AC:      284.3  mm     G. Age:  32w 3d         12  %    FL/BPD:     73.4   %    71 - 87
 FL:       61.2  mm     G. Age:  31w 5d        2.5  %    FL/AC:      21.5   %    20 - 24
 CER:      42.1  mm     G. Age:  33w 3d         18  %

 LV:        2.2  mm

 Est. FW:    [BZ]  gm      4 lb 5 oz      7  %
----------------------------------------------------------------------
OB History

 Blood Type:   A+
 Gravidity:    6         Term:   3        Prem:   0        SAB:   2
 TOP:          0       Ectopic:  0        Living: 3
----------------------------------------------------------------------
Gestational Age

 LMP:           40w 4d        Date:  [DATE]                 EDD:   [DATE]
 Clinical EDD:  34w 1d                                        EDD:   [DATE]
 U/S Today:     32w 4d                                        EDD:   [DATE]
 Best:          34w 1d     Det. By:  Clinical EDD             EDD:   [DATE]
----------------------------------------------------------------------
Anatomy

 Cranium:               Appears normal         Aortic Arch:            Previously seen
 Cavum:                 Appears normal         Ductal Arch:            Appears normal
 Ventricles:            Appears normal         Diaphragm:              Appears normal
 Choroid Plexus:        Previously seen        Stomach:                Appears normal, left
                                                                       sided
 Cerebellum:            Appears normal         Abdomen:                Previously seen
 Posterior Fossa:       Appears normal         Abdominal Wall:         Previously seen
 Nuchal Fold:           Previously seen        Cord Vessels:           Previously seen
 Face:                  Orbits and profile     Kidneys:                Appear normal
                        previously seen
 Lips:                  Previously seen        Bladder:                Appears normal
 Thoracic:              Appears normal         Spine:                  Previously seen
 Heart:                 Appears normal         Upper Extremities:      Previously seen
                        (4CH, axis, and
                        situs)
 RVOT:                  Appears normal         Lower Extremities:      Previously seen
 LVOT:                  Appears normal

 Other:  Female gender previously seen. Nasal bone, lenses, VC, 3VV and
         3VTV, Heels/feet and open hands/5th digits previously visualized.
         Technically difficult due to advanced GA and fetal position.
----------------------------------------------------------------------
Doppler - Fetal Vessels

 Umbilical Artery
  S/D     %tile      RI    %tile      PI    %tile     PSV    ADFV    RDFV
                                                    (cm/s)
  2.44       45    0.59       51    0.[REDACTED]      No      No

----------------------------------------------------------------------
Cervix Uterus Adnexa

 Cervix
 Not visualized (advanced GA >[BZ])

 Uterus
 No abnormality visualized.

 Right Ovary
 Within normal limits.

 Left Ovary
 Within normal limits.

 Cul De Sac
 No free fluid seen.

 Adnexa
 No abnormality visualized.
----------------------------------------------------------------------
Impression

 Follow up growth for FGR
 Today's measurements are again consistent with FGR EFW
 at th 7th%.
 The UA dopplers normal without evidence of AEDF or REDF
 Biophysical profile is [DATE] with good fetal movement and
 amniotic fluid.
----------------------------------------------------------------------
Recommendations

 Continue weekly testing with UA Dopplers
 Repeat growth in 4 weeks
 Consider delivery by 39 weeks unless antenatal testing is
 abnormal or the EFW is < 3rd%.
----------------------------------------------------------------------
----------------------------------------------------------------------

## 2020-10-21 ENCOUNTER — Ambulatory Visit: Payer: Medicaid Other

## 2020-10-27 ENCOUNTER — Encounter (HOSPITAL_COMMUNITY): Payer: Self-pay | Admitting: Obstetrics and Gynecology

## 2020-10-27 ENCOUNTER — Ambulatory Visit: Payer: Medicaid Other | Admitting: *Deleted

## 2020-10-27 ENCOUNTER — Ambulatory Visit (HOSPITAL_BASED_OUTPATIENT_CLINIC_OR_DEPARTMENT_OTHER): Payer: Medicaid Other

## 2020-10-27 ENCOUNTER — Other Ambulatory Visit: Payer: Self-pay

## 2020-10-27 ENCOUNTER — Observation Stay (HOSPITAL_COMMUNITY)
Admission: AD | Admit: 2020-10-27 | Discharge: 2020-10-28 | Disposition: A | Discharge status: Home / Self Care | Payer: Medicaid Other | Attending: Obstetrics and Gynecology | Admitting: Obstetrics and Gynecology

## 2020-10-27 ENCOUNTER — Encounter: Payer: Self-pay | Admitting: *Deleted

## 2020-10-27 VITALS — BP 116/85 | HR 78

## 2020-10-27 DIAGNOSIS — O365931 Maternal care for other known or suspected poor fetal growth, third trimester, fetus 1: Principal | ICD-10-CM | POA: Insufficient documentation

## 2020-10-27 DIAGNOSIS — Z3A35 35 weeks gestation of pregnancy: Secondary | ICD-10-CM | POA: Diagnosis not present

## 2020-10-27 DIAGNOSIS — Z20822 Contact with and (suspected) exposure to covid-19: Secondary | ICD-10-CM | POA: Insufficient documentation

## 2020-10-27 DIAGNOSIS — O365930 Maternal care for other known or suspected poor fetal growth, third trimester, not applicable or unspecified: Secondary | ICD-10-CM

## 2020-10-27 DIAGNOSIS — O365990 Maternal care for other known or suspected poor fetal growth, unspecified trimester, not applicable or unspecified: Secondary | ICD-10-CM

## 2020-10-27 LAB — RESP PANEL BY RT-PCR (FLU A&B, COVID) ARPGX2
Influenza A by PCR: NEGATIVE
Influenza B by PCR: NEGATIVE
SARS Coronavirus 2 by RT PCR: NEGATIVE

## 2020-10-27 LAB — URINALYSIS, ROUTINE W REFLEX MICROSCOPIC
Bilirubin Urine: NEGATIVE
Glucose, UA: NEGATIVE mg/dL
Hgb urine dipstick: NEGATIVE
Ketones, ur: NEGATIVE mg/dL
Nitrite: NEGATIVE
Protein, ur: NEGATIVE mg/dL
Specific Gravity, Urine: 1.013 (ref 1.005–1.030)
pH: 5 (ref 5.0–8.0)

## 2020-10-27 LAB — CBC
HCT: 32.5 % — ABNORMAL LOW (ref 36.0–46.0)
Hemoglobin: 11.1 g/dL — ABNORMAL LOW (ref 12.0–15.0)
MCH: 32.2 pg (ref 26.0–34.0)
MCHC: 34.2 g/dL (ref 30.0–36.0)
MCV: 94.2 fL (ref 80.0–100.0)
Platelets: 246 10*3/uL (ref 150–400)
RBC: 3.45 MIL/uL — ABNORMAL LOW (ref 3.87–5.11)
RDW: 13.2 % (ref 11.5–15.5)
WBC: 11.2 10*3/uL — ABNORMAL HIGH (ref 4.0–10.5)
nRBC: 0 % (ref 0.0–0.2)

## 2020-10-27 LAB — DIFFERENTIAL
Abs Immature Granulocytes: 0.05 10*3/uL (ref 0.00–0.07)
Basophils Absolute: 0 10*3/uL (ref 0.0–0.1)
Basophils Relative: 0 %
Eosinophils Absolute: 0.1 10*3/uL (ref 0.0–0.5)
Eosinophils Relative: 1 %
Immature Granulocytes: 0 %
Lymphocytes Relative: 16 %
Lymphs Abs: 1.8 10*3/uL (ref 0.7–4.0)
Monocytes Absolute: 0.7 10*3/uL (ref 0.1–1.0)
Monocytes Relative: 6 %
Neutro Abs: 8.5 10*3/uL — ABNORMAL HIGH (ref 1.7–7.7)
Neutrophils Relative %: 77 %

## 2020-10-27 LAB — OB RESULTS CONSOLE GBS: GBS: NEGATIVE

## 2020-10-27 LAB — TYPE AND SCREEN
ABO/RH(D): A POS
Antibody Screen: NEGATIVE

## 2020-10-27 IMAGING — US US MFM FETAL BPP W/O NON-STRESS
1 series · 15 of 28 positions shown · non-contrast
Comparison: none

[Series 1: us mfm fetal bpp w/o non-stress · 33 acquisitions, 15 frames shown]
[im 1/33]
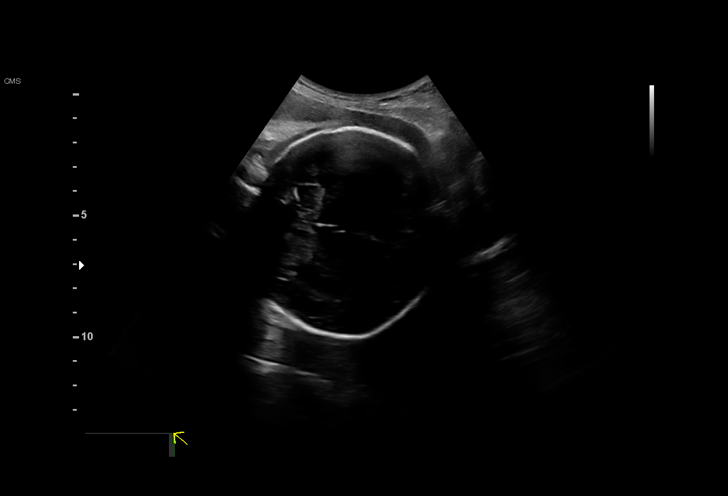
[im 3/33]
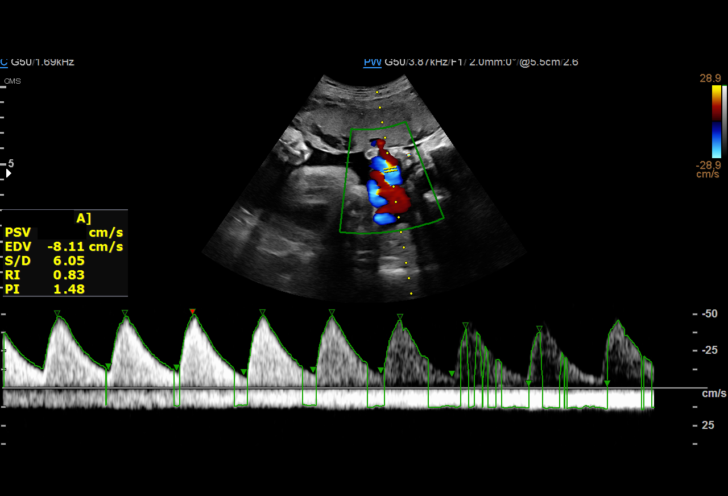
[im 5/33]
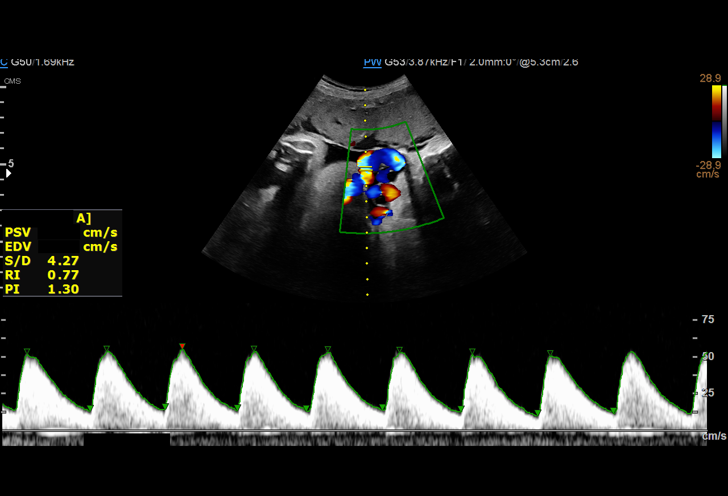
[im 8/33]
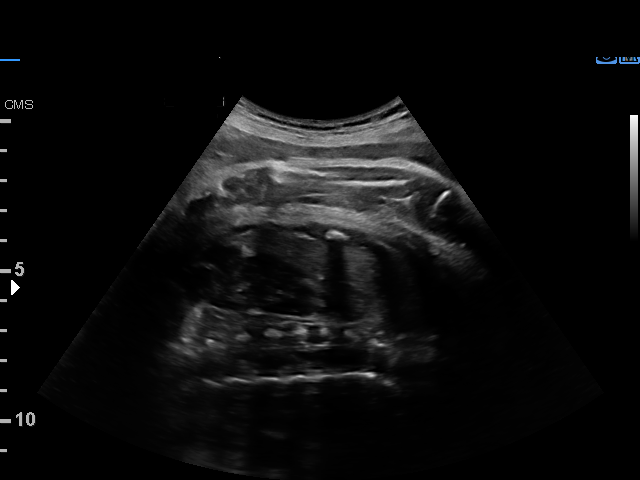
[im 10/33]
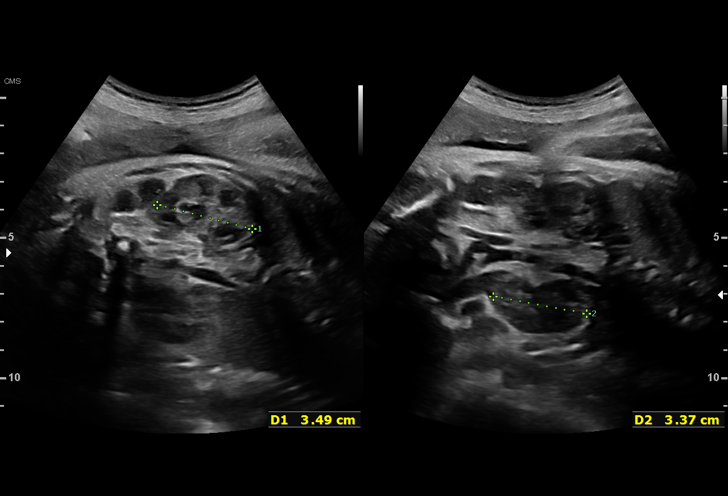
[im 12/33]
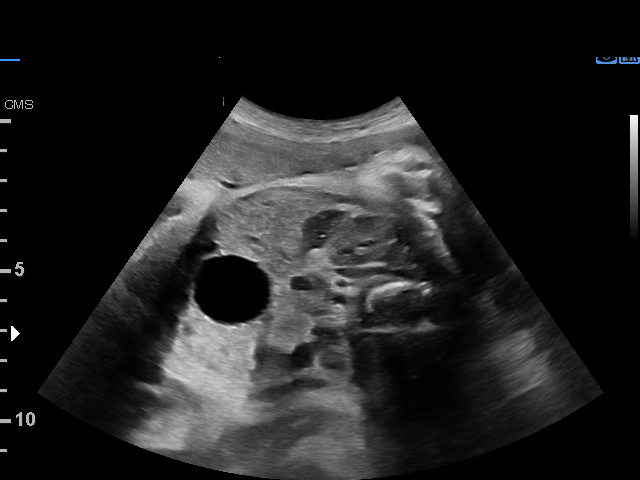
[im 15/33]
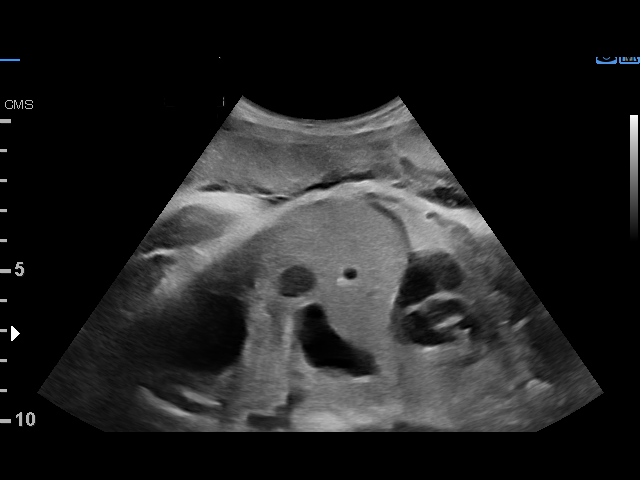
[im 17/33]
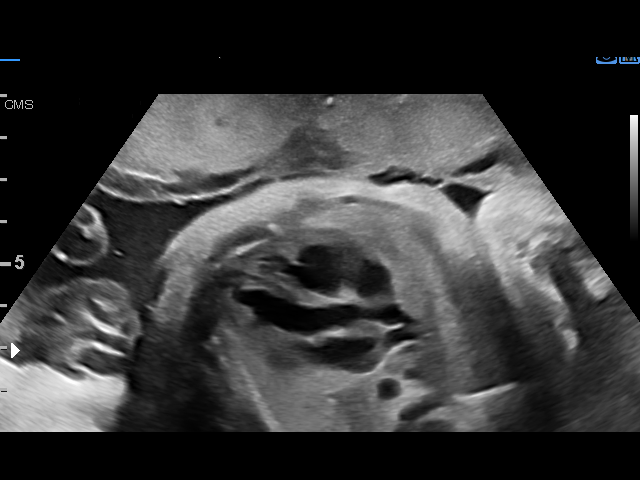
[im 18/33]
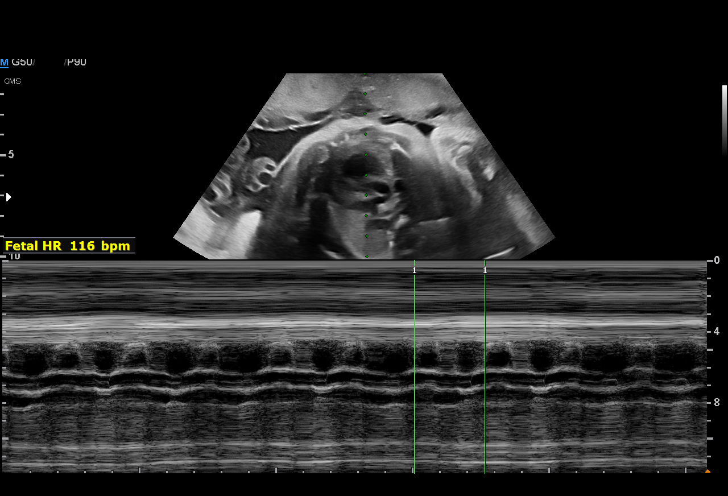
[im 21/33]
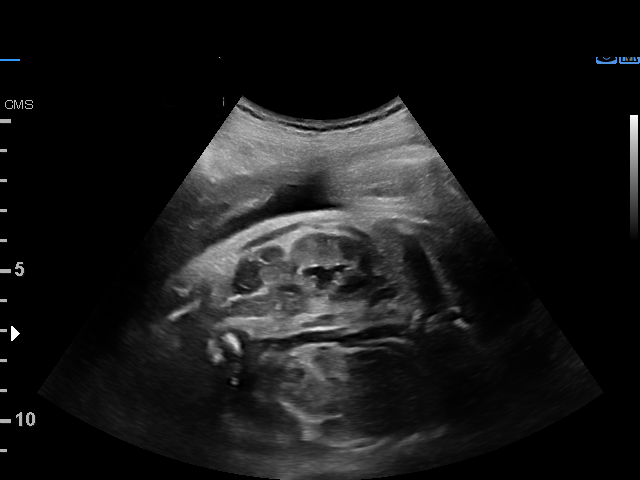
[im 23/33]
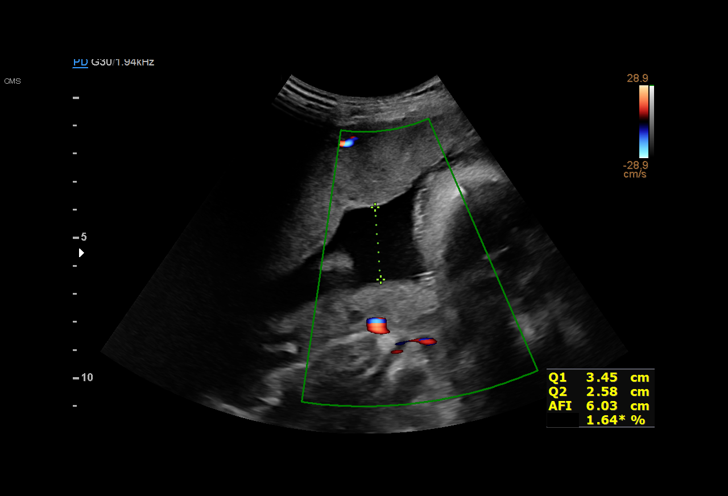
[im 25/33]
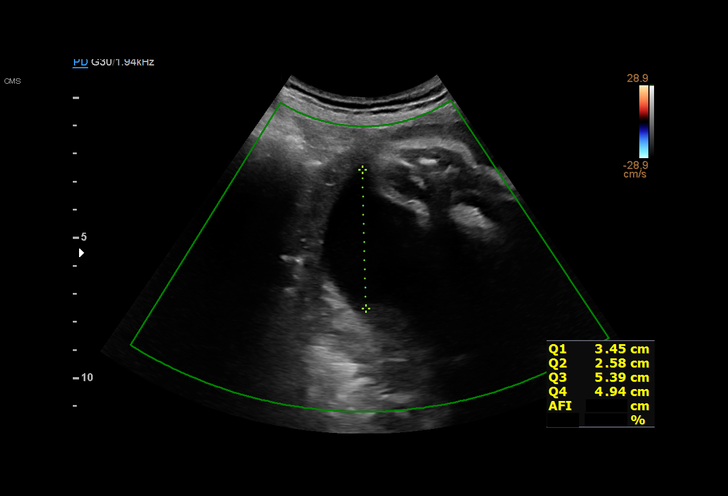
[im 28/33]
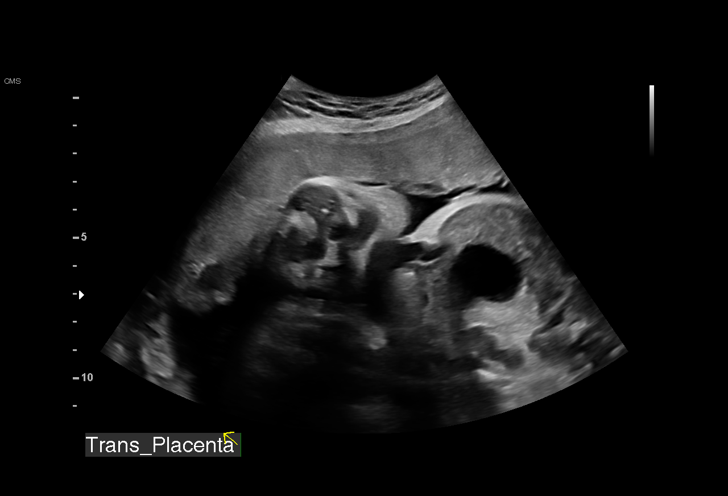
[im 30/33]
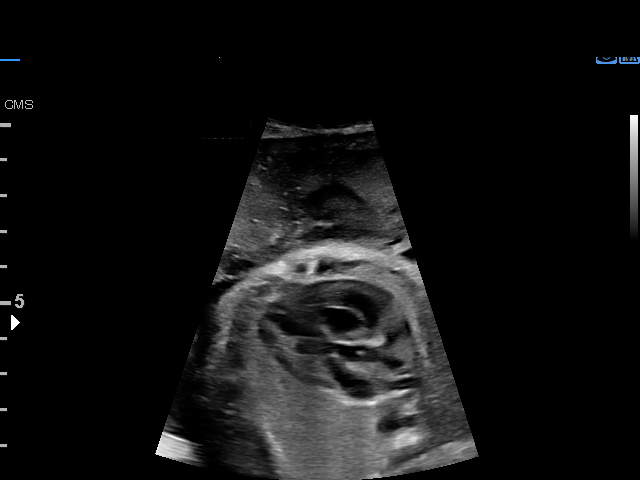
[im 33/33]
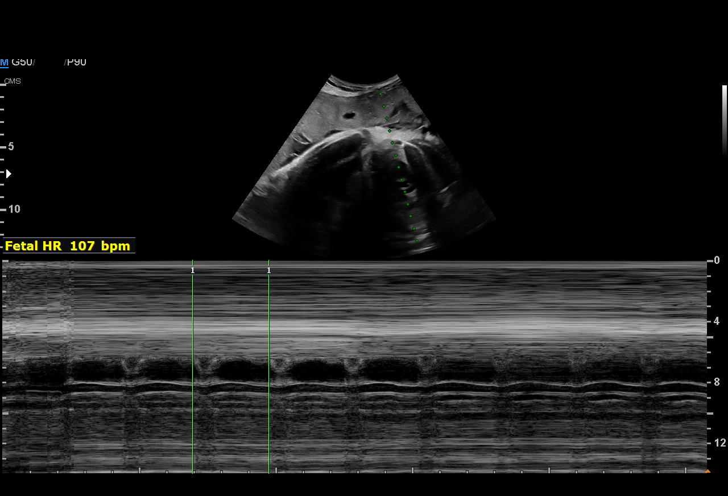

[15 of 28 positions shown; findings below may reference images not displayed]

OBGYN
                                                            [REDACTED]
                   AGUZZI

Indications

 35 weeks gestation of pregnancy
 Small for gestational age fetus affecting      [C9]
 management of mother
 Abnormal fetal ultrasound (? Amniotic band     [C9]
 on ED study)
 Previous cervical surgery (LEEP)               [C9]
 LR NIPS
Fetal Evaluation

 Num Of Fetuses:         1
 Fetal Heart Rate(bpm):  107
 Cardiac Activity:       Bradycardia
 Presentation:           Cephalic
 Placenta:               Anterior

 Amniotic Fluid
 AFI FV:      Within normal limits

 AFI Sum(cm)     %Tile       Largest Pocket(cm)
 16.4            60

 RUQ(cm)       RLQ(cm)       LUQ(cm)        LLQ(cm)

Biophysical Evaluation

 Amniotic F.V:   Within normal limits       F. Tone:        Observed
 F. Movement:    Observed                   Score:          [DATE]
 F. Breathing:   Observed
OB History

 Blood Type:   A+
 Gravidity:    6         Term:   3        Prem:   0        SAB:   2
 TOP:          0       Ectopic:  0        Living: 3
Gestational Age

 LMP:           41w 4d        Date:  [DATE]                 EDD:   [DATE]
 Clinical EDD:  35w 1d                                        EDD:   [DATE]
 Best:          35w 1d     Det. By:  Clinical EDD             EDD:   [DATE]
Doppler - Fetal Vessels

 Umbilical Artery
  S/D     %tile                                              ADFV    RDFV
     4   > 97.5                                                 No      No

Impression

 Antenatal testing due to IUGR with an EFW 7th% with an AC
 < 12th%.
 Biophysical profile [DATE] with good fetal movement and
 amniotic fluid volume
 UA Dopplers are elevated with no evidence of AEDF or
 REDF

 Of note the fetal heart rate was in the [C9] for the majority of
 the exam however it improved towards the end to the [C9].

 I discussed today's visit with Ms. AGUZZI and reviewed the new
 finding of abnormal UA dopplers. We discussed the
 managment to include 2x weekly testing and delivery at 37
 weeks if this finding persist.

 We have asked Ms. AGUZZI to go to the [facility] for NST. If
 reactive she will return in 1 week if no reactive she should be
 admitted given BMZ and evaluated for delivery.
Recommendations

 TO AGUZZI

## 2020-10-27 MED ORDER — ZOLPIDEM TARTRATE 5 MG PO TABS: 5.0000 mg | ORAL_TABLET | Freq: Every evening | ORAL | Status: DC | PRN

## 2020-10-27 MED ORDER — SODIUM CHLORIDE 0.9% FLUSH
3.0000 mL | INTRAVENOUS | Status: DC | PRN
  Administered 2020-10-27 – 2020-10-28 (×2): 3 mL via INTRAVENOUS

## 2020-10-27 MED ORDER — SODIUM CHLORIDE 0.9% FLUSH
3.0000 mL | Freq: Two times a day (BID) | INTRAVENOUS | Status: DC
  Administered 2020-10-28: 3 mL via INTRAVENOUS

## 2020-10-27 MED ORDER — PRENATAL MULTIVITAMIN CH
1.0000 | ORAL_TABLET | Freq: Every day | ORAL | Status: DC
  Administered 2020-10-28: 1 via ORAL
  Filled 2020-10-27: qty 1

## 2020-10-27 MED ORDER — BETAMETHASONE SOD PHOS & ACET 6 (3-3) MG/ML IJ SUSP
12.0000 mg | INTRAMUSCULAR | Status: AC
  Administered 2020-10-27 – 2020-10-28 (×2): 12 mg via INTRAMUSCULAR
  Filled 2020-10-27 (×2): qty 5

## 2020-10-27 MED ORDER — SODIUM CHLORIDE 0.9 % IV SOLN: 250.0000 mL | INTRAVENOUS | Status: DC | PRN

## 2020-10-27 MED ORDER — ACETAMINOPHEN 325 MG PO TABS
650.0000 mg | ORAL_TABLET | ORAL | Status: DC | PRN
  Administered 2020-10-28: 650 mg via ORAL
  Filled 2020-10-27: qty 2

## 2020-10-27 MED ORDER — CALCIUM CARBONATE ANTACID 500 MG PO CHEW: 2.0000 | CHEWABLE_TABLET | ORAL | Status: DC | PRN

## 2020-10-27 MED ORDER — DOCUSATE SODIUM 100 MG PO CAPS
100.0000 mg | ORAL_CAPSULE | Freq: Every day | ORAL | Status: DC
  Administered 2020-10-28: 100 mg via ORAL
  Filled 2020-10-27: qty 1

## 2020-10-27 MED ORDER — PANTOPRAZOLE SODIUM 40 MG PO TBEC
40.0000 mg | DELAYED_RELEASE_TABLET | Freq: Every day | ORAL | Status: DC
  Administered 2020-10-27 – 2020-10-28 (×2): 40 mg via ORAL
  Filled 2020-10-27 (×2): qty 1

## 2020-10-27 NOTE — H&P (Signed)
35 y.o. [redacted]w[redacted]d  [id number] comes in for observation after MFM [place] found FHTs to be low.  Pt was being evaluated for IUGR and had slightly elevated dopplers but no reverse or absent flow.  Otherwise has good fetal movement and no bleeding.  Past Medical History:  Diagnosis Date   Anxiety     Past Surgical History:  Procedure Laterality Date   LEEP      OB History  Gravida Para Term Preterm AB Living  6 3 3   2 3   SAB IAB Ectopic Multiple Live Births  2       3    # Outcome Date GA Lbr Len/2nd Weight Sex Delivery Anes PTL Lv  6 Current           5 Term           4 Term           3 Term           2 SAB           1 SAB             Social History   Socioeconomic History   Marital status: Single    Spouse name: Not on file   Number of children: Not on file   Years of education: Not on file   Highest education level: Not on file  Occupational History   Not on file  Tobacco Use   Smoking status: Never   Smokeless tobacco: Never  Vaping Use   Vaping Use: Never used  Substance and Sexual Activity   Alcohol use: Yes   Drug use: Never   Sexual activity: Not on file  Other Topics Concern   Not on file  Social History Narrative   Not on file   Social Determinants of Health   Financial Resource Strain: Not on file  Food Insecurity: Not on file  Transportation Needs: Not on file  Physical Activity: Not on file  Stress: Not on file  Social Connections: Not on file  Intimate Partner Violence: Not on file   Tramadol    Prenatal Transfer Tool  Maternal Diabetes: No Genetic Screening: Normal Maternal Ultrasounds/Referrals: Other: 3/30 MFM anatomy: EFW 65%, anterior placenta, cerv length 3cm, 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 Follow up scheduled in 6 weeks 5/11 MFM growth: EFW 31%, normal fluid, intrauterine synechiae no longer seen (may have been torn by fetal movements). Follow up growth  scheduled for 6 weeks. 7/7 MFM 9/7: BPP 8/8, AFI 15cm, normal dopplers. Growth next week 7/12 MFM growth: EFW 7% BPP 8/8, AFI 17cm, normal dopplers. Continue weekly dopplers and growth in 4 weeks. Consider delivery by 39 weeks unless change in antenatal testing or if EFW <3% Fetal Ultrasounds or other Referrals:  see aboveReferred to Materal Fetal Medicine  Maternal Substance Abuse:  No Significant Maternal Medications:  None Significant Maternal Lab Results: None  Today MFM 9/12:  Impression  Antenatal testing due to IUGR with an EFW 7th% with an AC  < 12th%.  Biophysical profile 8/8 with good fetal movement and  amniotic fluid volume  UA Dopplers are elevated with no evidence of AEDF or  REDF  Of note the fetal heart rate was in the 100's for the majority of  the exam however it improved towards the end to the 140's.  I discussed today's visit with Ms. [place] and reviewed the new  finding of abnormal UA dopplers. We discussed the  managment to include 2x weekly testing and delivery at 37  weeks if this finding persist.  We have asked Ms. Canova to go to the MAU for NST. If  reactive she will return in 1 week if no reactive she should be  admitted given BMZ and evaluated for delivery.  In MAU, FHTs were gSTV and LTV but did not become reactive until 30 minutes into exam.    Vitals:   10/27/20 1742  BP: 119/75  Pulse: 75  Resp: 17  Temp: 98.5 F (36.9 C)  TempSrc: Oral  SpO2: 99%  Weight: 53.2 kg  Height: 5\' 5"  (1.651 m)    Lungs/Cor:  NAD Abdomen:  soft, gravid Ex:  no cords, erythema SVE:  deferred FHTs:  now 120, good STV, NST R; Cat 1 tracing for last 120 minutes. Toco:  q occ   A/P   35 y.o. 20 [redacted]w[redacted]d with IUGR with elevated dopplers and low baseline in MFM.  MFM sent pt to be evaluated at MAU and if NST was reactive, could sent be home.  However, her NST took time to become reactive and I am electing to admit her for steroids and observation over night.  GBS  pending today.    [redacted]w[redacted]d

## 2020-10-27 NOTE — MAU Note (Addendum)
Patient arrived from ob office for low FHR in office. Patient denies vaginal bleeding, and leakage of fluid. positive fetal movement reported. Efm commenced

## 2020-10-27 NOTE — MAU Provider Note (Signed)
History     CSN: [id number]  Arrival date and time: 10/27/20 1718   Event Date/Time   First Provider Initiated Contact with Patient 10/27/20 1810      Chief Complaint  Patient presents with   Non-stress Test   HPI Susan Neal is a 35 y.o. [id number] at 103w2d who presents from MFM for fetal monitoring. Patient seen by Dr. Grace Bushy today - baby is IUGR with EFW in the 7th percentile. Also has elevated UA dopplers but no AEDF or REDF.  She denies abdominal pain, vaginal bleeding, or LOF. Reports good fetal movement.   OB History     Gravida  6   Para  3   Term  3   Preterm      AB  2   Living  3      SAB  2   IAB      Ectopic      Multiple      Live Births  3           Past Medical History:  Diagnosis Date   Anxiety     Past Surgical History:  Procedure Laterality Date   leap      Family History  Problem Relation Age of Onset   Heart disease Mother    Diabetes Maternal Grandmother    Cancer Maternal Grandmother     Social History   Tobacco Use   Smoking status: Never   Smokeless tobacco: Never  Vaping Use   Vaping Use: Never used  Substance Use Topics   Alcohol use: Yes   Drug use: Never    Allergies:  Allergies  Allergen Reactions   Tramadol     Medications Prior to Admission  Medication Sig Dispense Refill Last Dose   acetaminophen (TYLENOL) 325 MG tablet Take 650 mg by mouth every 6 (six) hours as needed.   Past Month   ondansetron (ZOFRAN-ODT) 8 MG disintegrating tablet Take 8 mg by mouth every 8 (eight) hours as needed for nausea or vomiting.   Past Month   Prenatal MV & Min w/FA-DHA (PRENATAL GUMMIES PO) Take by mouth.   10/26/2020   omeprazole (PRILOSEC) 20 MG capsule Take 1 capsule (20 mg total) by mouth daily. (Patient not taking: Reported on 07/02/2020) 30 capsule 0     Review of Systems  Constitutional: Negative.   Gastrointestinal: Negative.   Genitourinary: Negative.   Physical Exam   Blood pressure 119/75,  pulse 75, temperature 98.5 F (36.9 C), temperature source Oral, resp. rate 17, height 5\' 5"  (1.651 m), weight 53.2 kg, last menstrual period 01/01/2020, SpO2 99 %.  Physical Exam Vitals and nursing note reviewed.  Constitutional:      General: She is not in acute distress.    Appearance: Normal appearance. She is not ill-appearing.  HENT:     Head: Normocephalic and atraumatic.  Eyes:     General: No scleral icterus. Pulmonary:     Effort: Pulmonary effort is normal. No respiratory distress.  Neurological:     Mental Status: She is alert.  Psychiatric:        Mood and Affect: Mood normal.        Behavior: Behavior normal.   NST:  Baseline: 115 bpm, Variability: Good {> 6 bpm), Accelerations: 10x10, and Decelerations: Absent  MAU Course  Procedures  MDM Patient sent from MFM for fetal monitoring. She currently has no complaints. Per Dr. 01/03/2020 recommendation, she should be admitted for monitoring & BMZ if NST  non reactive.  Non reactive initially - low baseline, good variability, no decels, but no 15x15 accels.   Dr. Henderson Cloud notified of patient's tracing & MFM's recommendation. Will admit for extended monitoring, repeat ultrasound, and BMZ.   Tracing reactive prior to patient being transferred to [facility].   Assessment and Plan   1. Poor fetal growth affecting management of mother in third trimester, single or unspecified fetus   2. [redacted] weeks gestation of pregnancy   3. IUGR (intrauterine growth restriction) affecting care of mother    -Dr. Henderson Cloud to admit to [facility] unit - pt & her spouse are agreeable with the plan  Jaylen Claude 10/27/2020, 6:10 PM

## 2020-10-28 ENCOUNTER — Observation Stay (HOSPITAL_BASED_OUTPATIENT_CLINIC_OR_DEPARTMENT_OTHER): Payer: Medicaid Other

## 2020-10-28 DIAGNOSIS — O3443 Maternal care for other abnormalities of cervix, third trimester: Secondary | ICD-10-CM | POA: Diagnosis not present

## 2020-10-28 DIAGNOSIS — Z3A35 35 weeks gestation of pregnancy: Secondary | ICD-10-CM

## 2020-10-28 DIAGNOSIS — O289 Unspecified abnormal findings on antenatal screening of mother: Secondary | ICD-10-CM

## 2020-10-28 DIAGNOSIS — O365930 Maternal care for other known or suspected poor fetal growth, third trimester, not applicable or unspecified: Secondary | ICD-10-CM

## 2020-10-28 DIAGNOSIS — N879 Dysplasia of cervix uteri, unspecified: Secondary | ICD-10-CM

## 2020-10-28 DIAGNOSIS — O368330 Maternal care for abnormalities of the fetal heart rate or rhythm, third trimester, not applicable or unspecified: Secondary | ICD-10-CM | POA: Diagnosis not present

## 2020-10-28 LAB — RPR: RPR Ser Ql: NONREACTIVE

## 2020-10-28 IMAGING — US US MFM FETAL BPP W/O NON-STRESS
1 series · 15 of 28 positions shown · non-contrast
Comparison: none

[Series 1: us mfm fetal bpp w/o non-stress · 38 acquisitions, 15 frames shown]
[im 1/38]
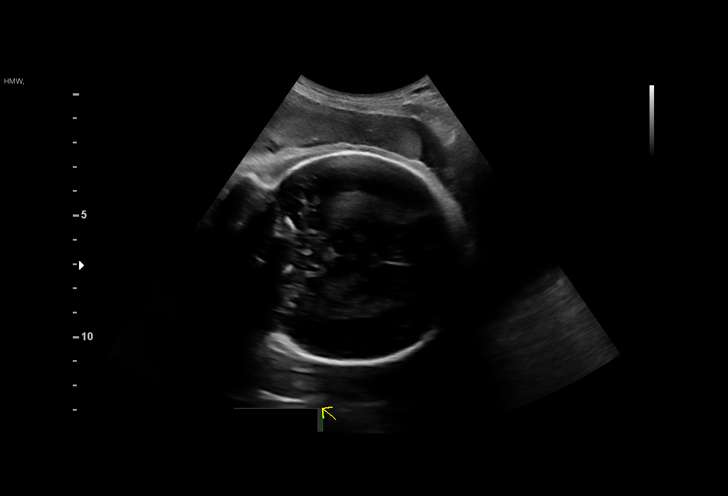
[im 3/38]
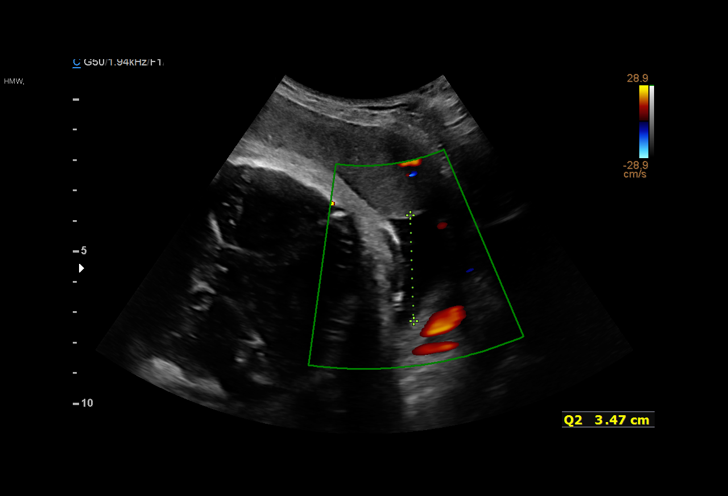
[im 6/38]
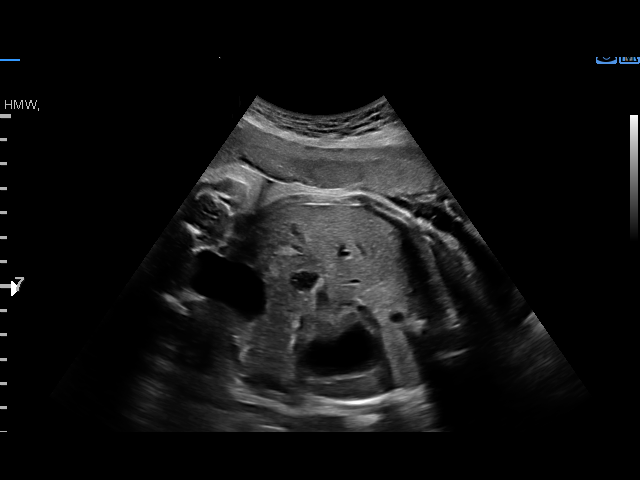
[im 9/38]
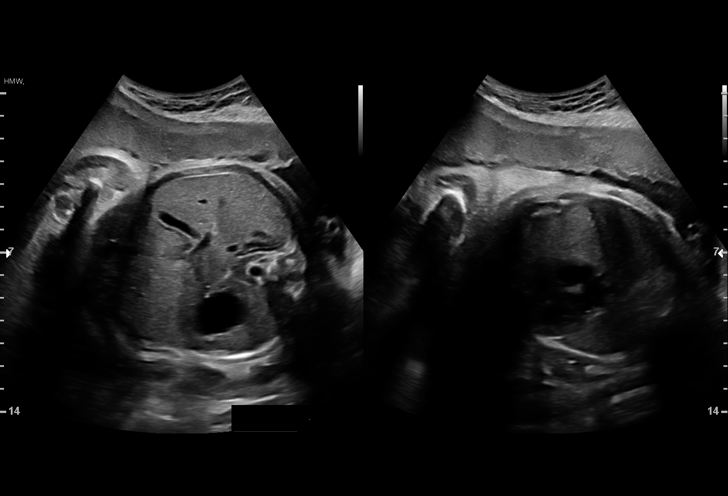
[im 11/38]
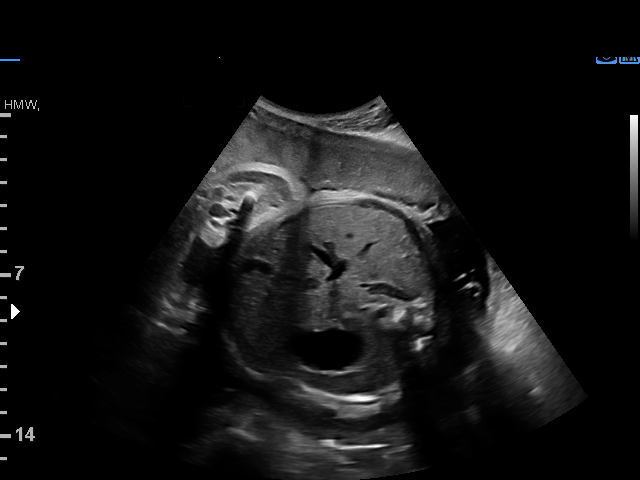
[im 14/38]
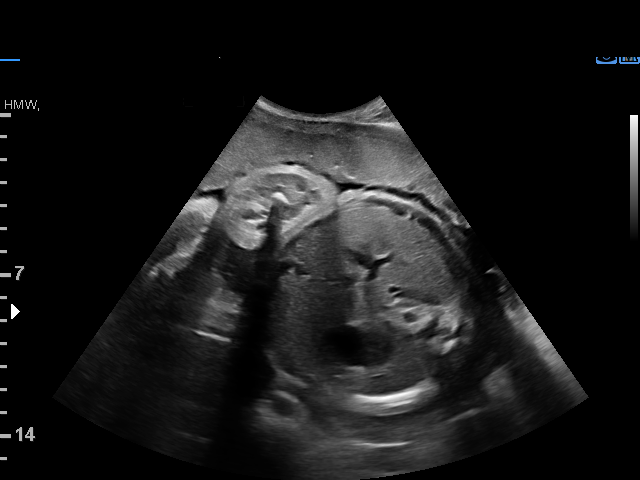
[im 17/38]
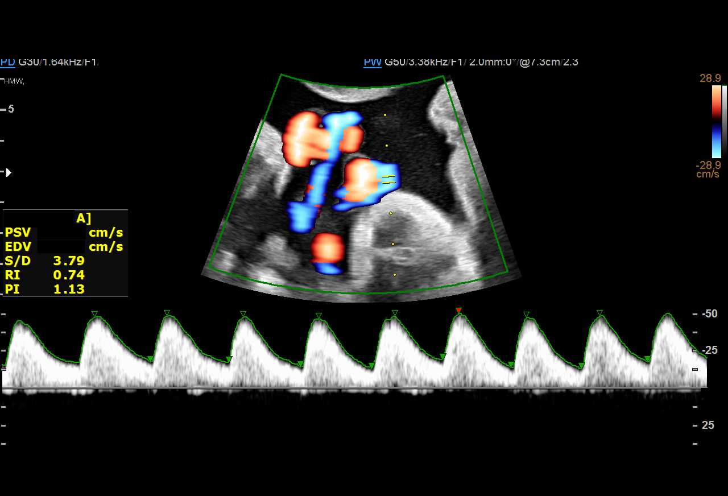
[im 20/38]
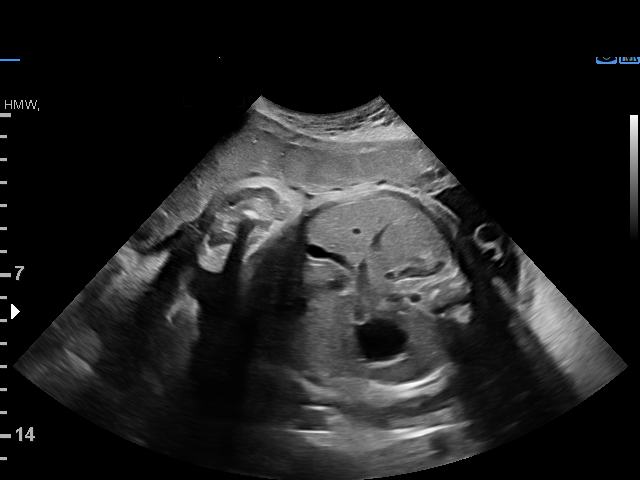
[im 21/38]
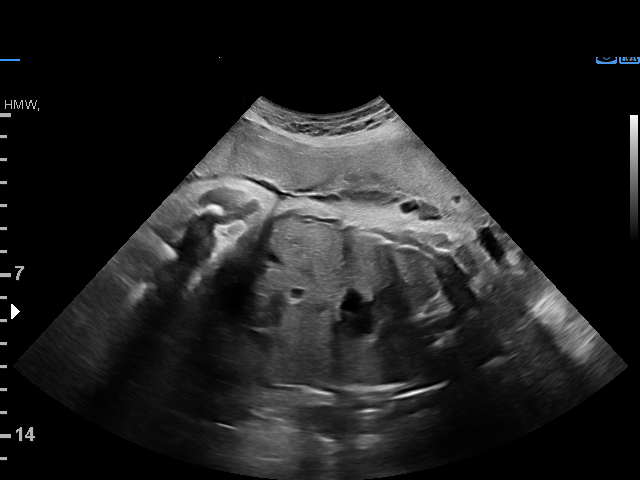
[im 24/38]
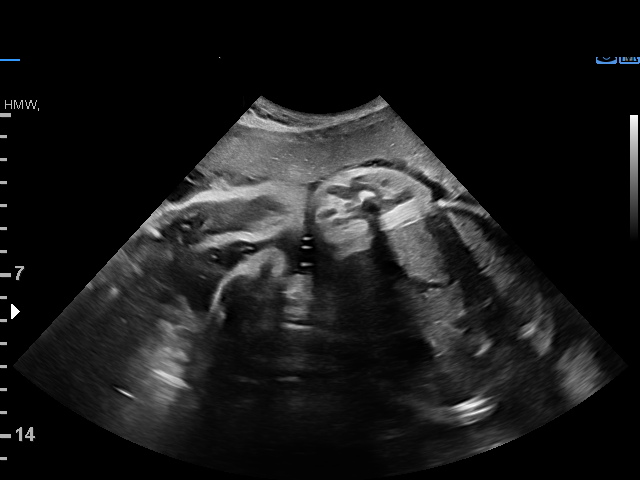
[im 27/38]
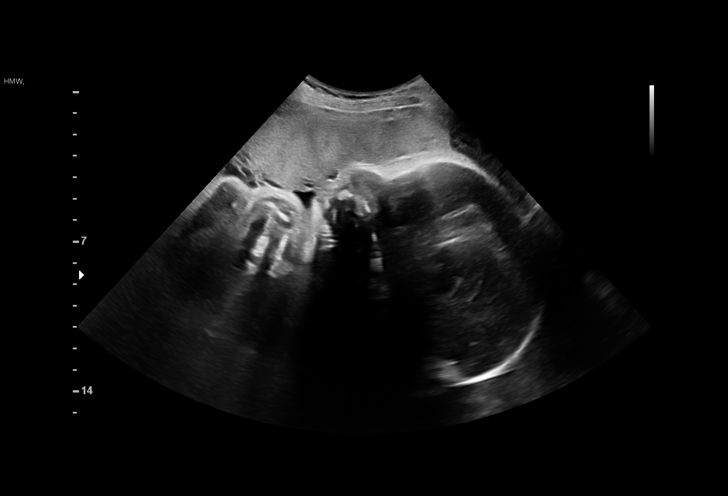
[im 29/38]
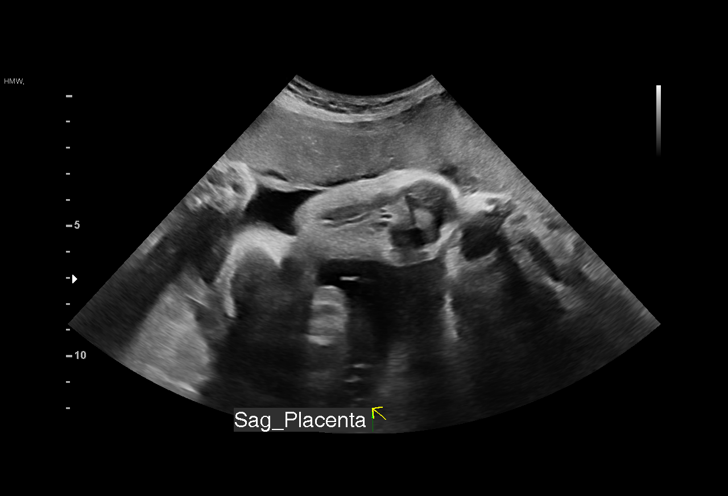
[im 32/38]
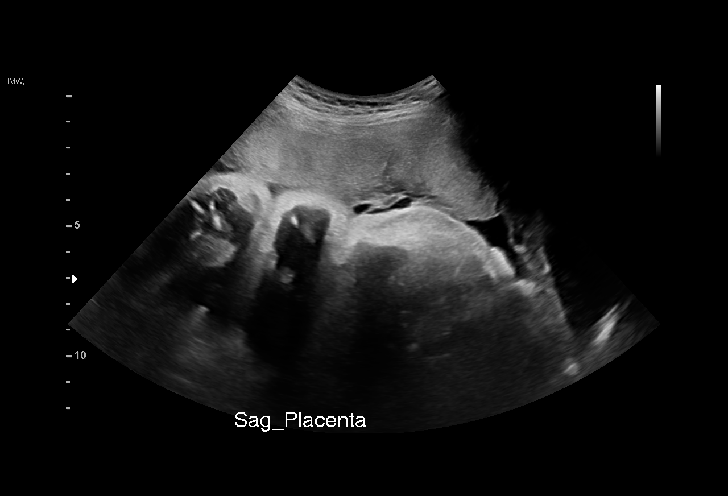
[im 35/38]
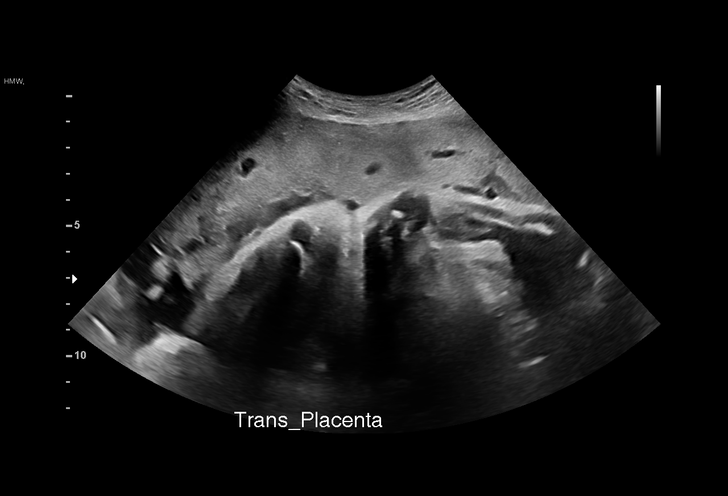
[im 38/38]
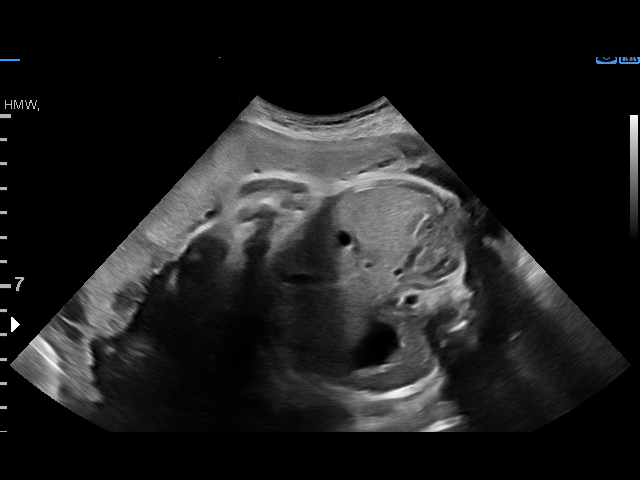

[15 of 28 positions shown; findings below may reference images not displayed]

OBGYN
                                                            [REDACTED]
                   DON LOLITO

 2  US MFM UA CORD DOPPLER                76820.02    DON LOLITO

Indications

 35 weeks gestation of pregnancy
 Small for gestational age fetus affecting      [T1]
 management of mother
 Abnormal fetal ultrasound (? Amniotic band     [T1]
 on ED study)
 Previous cervical surgery (LEEP)               [T1]
 LR NIPS
 Abnormal fetal heart rate (bradycardia         O76
 [DATE])
Fetal Evaluation

 Num Of Fetuses:         1
 Fetal Heart Rate(bpm):  113
 Cardiac Activity:       Observed
 Presentation:           Cephalic
 Placenta:               Anterior

 Amniotic Fluid
 AFI FV:      Within normal limits

 AFI Sum(cm)     %Tile       Largest Pocket(cm)
 13.6            47

 RUQ(cm)       RLQ(cm)       LUQ(cm)        LLQ(cm)

Biophysical Evaluation

 Amniotic F.V:   Within normal limits       F. Tone:        Observed
 F. Movement:    Observed                   Score:          [DATE]
 F. Breathing:   Observed
OB History

 Blood Type:   A+
 Gravidity:    6         Term:   3        Prem:   0        SAB:   2
 TOP:          0       Ectopic:  0        Living: 3
Gestational Age

 LMP:           41w 5d        Date:  [DATE]                 EDD:   [DATE]
 Clinical EDD:  35w 2d                                        EDD:   [DATE]
 Best:          35w 2d     Det. By:  Clinical EDD             EDD:   [DATE]
Anatomy

 Thoracic:              Appears normal         Bladder:                Appears normal
 Stomach:               Appears normal, left
                        sided
Doppler - Fetal Vessels

 Umbilical Artery
  S/D     %tile      RI    %tile      PI    %tile            ADFV    RDFV
  3.52       94    0.72       95    1.12       92                N       N

Cervix Uterus Adnexa

 Cervix
 Not visualized (advanced GA >[T1])
Impression

 Fetal growth restriction.  On ultrasound performed on
 [DATE], the estimated fetal weight was at the 7th
 percentile.
 Yesterday, the there were episodes of bradycardia that
 resolved.  Umbilical artery Doppler showed increased S/D
 ratio.
 Patient was admitted to the hospital for NST monitoring.  NST
 is reactive.

 On today's ultrasound, amniotic fluid is normal good fetal
 activity seen.  Antenatal testing is reassuring.  BPP [DATE].
 Umbilical artery Doppler showed normal forward diastolic flow
 (upper limit of normal).
                 DON LOLITO

## 2020-10-28 MED ORDER — BUTORPHANOL TARTRATE 1 MG/ML IJ SOLN
1.0000 mg | INTRAMUSCULAR | Status: AC
  Administered 2020-10-28: 1 mg via INTRAVENOUS
  Filled 2020-10-28: qty 1

## 2020-10-28 NOTE — Progress Notes (Signed)
Pt continues to complain of abd tightness and pelvic pressure. MD was notified earlier in shift and encouraged a cervical exam if CTX of 4 in 1 hour was recorded on EFM strip. Tylenol was also instructed to be given in which pt found relief for a short period of time. At this time, pt is rating pain from 0-7 on scale of 0-10. Patient states she wants to "hold off" on any further pain medication and a cervical exam "as the last result". There is no evidence of LOF or bleeding.

## 2020-10-28 NOTE — Progress Notes (Signed)
Gwynevere Lizana 35 y.o. [id number] at [redacted]w[redacted]d HD#2 admitted for fetal monitoring S: Reports sharp pain to her vagina, occasional tightening across her abdomen. Patient and husband concerned about uncertain plan regarding delivery/discharge and concerned about baby's heart rate. No other concerns.  O: Vitals:   10/27/20 1937 10/27/20 2315 10/28/20 0419 10/28/20 0810  BP: (!) 126/92 (!) 109/55 117/72 107/64  Pulse: 77 71 65 73  Resp: 18 16 16 18   Temp: 98.4 F (36.9 C) 98.2 F (36.8 C) 98 F (36.7 C) 98.4 F (36.9 C)  TempSrc: Oral Oral Oral Oral  SpO2:  99% 100% 100%  Weight:      Height:        A/p: Maclaine Ahola 35 y.o. 20 at [redacted]w[redacted]d admitted for fetal monitoring for FGR Fetal monitoring: yesterday had MFM [redacted]w[redacted]d for FGR, noted to have low FHR, sent to MAU for NST, reassuring but not reactive right away, admitted for extended fetal monitoring. Overall monitoring reassuring, FHR 110-130, with accels, moderate variability. Has had infrequent variable decels, last at 10:30am to 70 for 90 seconds and 6:22am to 90 for 2 minutes.  -Continue fetal monitoring -Follow up final read of BPP this AM Preterm: BMZ#1 last night, ordered #2 tonight Fetal growth restriction: Last EFW 7/12: 1959g 7%, AC 12% -UA dopplers elevated 7/19, MFM recommends 2x week testing and delivery at 37w if findings persist.  Pelvic pain, likely round ligament pain and braxton hicks contractions. Given tylenol and percocet overnight. SVE 1.5cm. Discussed supportive measures and continue to monitor for signs of preterm labor  Dispo: Discussed with patient and husband pending fetal monitoring today may need to proceed with IOL today vs. Continued admission for continuous monitoring vs. discharge  Kamari Buch K Taam-Akelman 10/28/20 10:42 AM

## 2020-10-28 NOTE — Progress Notes (Signed)
Teaching complete  pt ambulated out with husband  will follow up as ordered

## 2020-10-28 NOTE — Discharge Summary (Addendum)
Postpartum Discharge Summary     Patient Name: Susan Neal DOB: 03-13-1986 MRN: [id number]  Date of admission: 10/27/2020 Date of discharge: 10/28/2020  Admitting diagnosis: IUGR (intrauterine growth restriction) affecting care of mother [O36.5990] Intrauterine pregnancy: [redacted]w[redacted]d     Secondary diagnosis:  Active Problems:   IUGR (intrauterine growth restriction) affecting care of mother  Additional problems: none    Discharge diagnosis:  pregnant at  [redacted]w[redacted]d, fetal growth restriction                                           Hospital course: Susan Neal was admitted 7/19 from MFM [place] for extended fetal monitoring due to fetal bradycardia on BPP. She was given a course of betamethasone, 7/19 and 7/20. On antepartum she had continuous fetal monitoring which was overall reassuring (she had a few variable decels, but overall reactive monitoring). She had a repeat BPP on 7/20. I discussed her discharge with Dr. Judeth Cornfield, given reassuring fetal monitoring, appropriate for discharge with follow up in MFM clinic 7/25.  Final [place] read 10/28/2020: Impression  Fetal growth restriction.  On ultrasound performed on  10/20/2020, the estimated fetal weight was at the 7th  percentile.  Yesterday, the there were episodes of bradycardia that  resolved.  Umbilical artery Doppler showed increased S/D  ratio.  Patient was admitted to the hospital for NST monitoring.  NST  is reactive.  On today's ultrasound, amniotic fluid is normal good fetal  activity seen.  Antenatal testing is reassuring.  BPP 8/8.  Umbilical artery Doppler showed normal forward diastolic flow  (upper limit of normal).  BMZ received: Yes  Physical exam  Vitals:   10/28/20 0419 10/28/20 0810 10/28/20 1137 10/28/20 1528  BP: 117/72 107/64 106/62 112/74  Pulse: 65 73 68 64  Resp: 16 18 17 18   Temp: 98 F (36.7 C) 98.4 F (36.9 C) 98.2 F (36.8 C) 98 F (36.7 C)  TempSrc: Oral Oral Oral Oral  SpO2: 100% 100% 100% 100%   Weight:      Height:       General: alert and no distress Abdomen gravida  DVT Evaluation: No evidence of DVT seen on physical exam. Labs: Lab Results  Component Value Date   WBC 11.2 (H) 10/27/2020   HGB 11.1 (L) 10/27/2020   HCT 32.5 (L) 10/27/2020   MCV 94.2 10/27/2020   PLT 246 10/27/2020   CMP Latest Ref Rng & Units 07/02/2020  Glucose 70 - 99 mg/dL 07/04/2020)  BUN 6 - 20 mg/dL 10  Creatinine 468(E - 3.21 mg/dL 2.24  Sodium 8.25 - 003 mmol/L 133(L)  Potassium 3.5 - 5.1 mmol/L 3.7  Chloride 98 - 111 mmol/L 105  CO2 22 - 32 mmol/L 20(L)  Calcium 8.9 - 10.3 mg/dL 9.2  Total Protein 6.5 - 8.1 g/dL 7.9  Total Bilirubin 0.3 - 1.2 mg/dL 0.6  Alkaline Phos 38 - 126 U/L 47  AST 15 - 41 U/L 19  ALT 0 - 44 U/L 14     After visit meds:  Allergies as of 10/28/2020       Reactions   Tramadol Nausea And Vomiting        Medication List     STOP taking these medications    omeprazole 20 MG capsule Commonly known as: PRILOSEC       TAKE these medications  acetaminophen 325 MG tablet Commonly known as: TYLENOL Take 650 mg by mouth every 6 (six) hours as needed.   ondansetron 8 MG disintegrating tablet Commonly known as: ZOFRAN-ODT Take 8 mg by mouth every 8 (eight) hours as needed for nausea or vomiting.   PRENATAL GUMMIES PO Take by mouth.         Discharge home in stable condition Future Appointments: Future Appointments  Date Time Provider Department Center  11/02/2020  7:30 AM [facility] NURSE [facility]  11/02/2020  7:45 AM [facility]  11/09/2020  1:30 PM [facility] NURSE [facility]  11/09/2020  1:45 PM [facility]4 [facility]   Follow up Visit:  Follow-up Information     Philip Aspen, DO Follow up.   Specialty: Obstetrics and Gynecology Why: Appointment scheduled 11/03/2020 Contact information: [address] [place] [address] [phone]                     10/28/2020 Rande Brunt,  MD

## 2020-10-28 NOTE — Progress Notes (Signed)
FHTs 120s, gSTV, NST R, cat 1 Toco occ

## 2020-10-29 ENCOUNTER — Encounter (HOSPITAL_COMMUNITY): Payer: Self-pay | Admitting: Obstetrics and Gynecology

## 2020-10-29 ENCOUNTER — Other Ambulatory Visit: Payer: Self-pay

## 2020-10-29 ENCOUNTER — Inpatient Hospital Stay (HOSPITAL_COMMUNITY)
Admission: AD | Admit: 2020-10-29 | Discharge: 2020-10-30 | Disposition: A | Discharge status: Home / Self Care | Payer: Medicaid Other | Attending: Obstetrics and Gynecology | Admitting: Obstetrics and Gynecology

## 2020-10-29 DIAGNOSIS — Z79899 Other long term (current) drug therapy: Secondary | ICD-10-CM | POA: Insufficient documentation

## 2020-10-29 DIAGNOSIS — Z3A35 35 weeks gestation of pregnancy: Secondary | ICD-10-CM

## 2020-10-29 DIAGNOSIS — Z3689 Encounter for other specified antenatal screening: Secondary | ICD-10-CM

## 2020-10-29 DIAGNOSIS — O368130 Decreased fetal movements, third trimester, not applicable or unspecified: Secondary | ICD-10-CM

## 2020-10-29 LAB — CULTURE, BETA STREP (GROUP B ONLY)

## 2020-10-29 NOTE — MAU Provider Note (Signed)
History   [id number]   Chief Complaint  Patient presents with   Decreased Fetal Movement    HPI Susan Neal is a 35 y.o. female  [phone] here with report of decreased fetal movement since yesterday.  Reports feeling the baby move approximately 0 times in the past 24 hour.  Denies vaginal bleeding or leaking of fluid.  Feels occasional contraction which she describes as tightening, no painful.  Was admitted for BMZ & monitoring due to IUGR. Was discharged yesterday & reports significant decrease in movement since being discharged.   Patient's last menstrual period was 01/01/2020 (approximate).  OB History  Gravida Para Term Preterm AB Living  6 3 3   2 3   SAB IAB Ectopic Multiple Live Births  2       3    # Outcome Date GA Lbr Len/2nd Weight Sex Delivery Anes PTL Lv  6 Current           5 Term           4 Term           3 Term           2 SAB           1 SAB             Past Medical History:  Diagnosis Date   Anxiety     Family History  Problem Relation Age of Onset   Heart disease Mother    Diabetes Maternal Grandmother    Cancer Maternal Grandmother     Social History   Socioeconomic History   Marital status: Married    Spouse name:   Number of children: Not on file   Years of education: Not on file   Highest education level: Not on file  Occupational History   Not on file  Tobacco Use   Smoking status: Never   Smokeless tobacco: Never  Vaping Use   Vaping Use: Never used  Substance and Sexual Activity   Alcohol use: Yes   Drug use: Never   Sexual activity: Not on file  Other Topics Concern   Not on file  Social History Narrative   Not on file   Social Determinants of Health   Financial Resource Strain: Not on file  Food Insecurity: Not on file  Transportation Needs: Not on file  Physical Activity: Not on file  Stress: Not on file  Social Connections: Not on file    Allergies  Allergen Reactions   Tramadol Nausea And  Vomiting    No current facility-administered medications on file prior to encounter.   Current Outpatient Medications on File Prior to Encounter  Medication Sig Dispense Refill   acetaminophen (TYLENOL) 325 MG tablet Take 650 mg by mouth every 6 (six) hours as needed.     ondansetron (ZOFRAN-ODT) 8 MG disintegrating tablet Take 8 mg by mouth every 8 (eight) hours as needed for nausea or vomiting.     Prenatal MV & Min w/FA-DHA (PRENATAL GUMMIES PO) Take by mouth.       Review of Systems   Physical Exam   Vitals:   10/29/20 2309 10/29/20 2311  BP:  117/71  Pulse:  89  Resp: 18   Temp: 97.9 F (36.6 C)   Weight: 53.1 kg   Height: 5\' 5"  (1.651 m)     Physical Exam Vitals and nursing note reviewed.  Constitutional:      General: She is not in acute  distress.    Appearance: Normal appearance.  HENT:     Head: Normocephalic and atraumatic.  Eyes:     General: No scleral icterus. Pulmonary:     Effort: Pulmonary effort is normal. No respiratory distress.  Abdominal:     Palpations: Abdomen is soft.     Tenderness: There is no abdominal tenderness.  Skin:    General: Skin is warm and dry.  Neurological:     Mental Status: She is alert.  Psychiatric:        Mood and Affect: Mood normal.        Behavior: Behavior normal.   NST:  Baseline: 120 bpm, Variability: Good {> 6 bpm), Accelerations: Reactive, and Decelerations: Absent  MAU Course  Procedures  MDM Patient presents with report of decreased fetal movement since being discharged from [facility] unit yesterday.  In MAU - she documented 5 + movements within 15 minutes. Reactive fetal tracing. Movement heard through monitor & palpated by this provider.  Patient admits that there are significant stressors at home including recent IUGR diagnosis, unsure of possible preterm delivery due to this diagnosis & upcoming out of state move that is supposed to occur next week. Had thorough discussion with patient & her spouse about  reasons for decreased perception of movement & ways to track movements at home if there are any concerns.  Fetal monitoring & movement is reassuring in MAU. She has had 2 BPPs this week that were both 8/8. She has follow up with MFM on Monday and with her ob/gyn on Tuesday. Offered further monitoring while in MAU - pt & spouse are reassured & opt to go home but will return to MAU with any further concerns regarding movement.  Assessment and Plan   1. Decreased fetal movements in third trimester, single or unspecified fetus   2. NST (non-stress test) reactive   3. [redacted] weeks gestation of pregnancy    -reviewed reasons to return to MAU -Discussed tracking fetal movement at home -keep all f/u appointments with MFM & ob   Judeth Horn, NP 10/29/2020 11:43 PM

## 2020-10-29 NOTE — MAU Note (Signed)
Pt just left [facility] yesterday after 24hr stay. Pt has fetal growth restriction and is being monitored closely. Has not noticed much FM at all today. Usually baby responds to touch to abdomen and is not responding. Feeling some tightness in abdomen at times. "Just does not feel right as before"

## 2020-10-30 DIAGNOSIS — Z3A35 35 weeks gestation of pregnancy: Secondary | ICD-10-CM | POA: Diagnosis not present

## 2020-10-30 DIAGNOSIS — Z79899 Other long term (current) drug therapy: Secondary | ICD-10-CM | POA: Diagnosis not present

## 2020-10-30 DIAGNOSIS — Z3689 Encounter for other specified antenatal screening: Secondary | ICD-10-CM | POA: Diagnosis not present

## 2020-10-30 DIAGNOSIS — O368130 Decreased fetal movements, third trimester, not applicable or unspecified: Secondary | ICD-10-CM | POA: Diagnosis present

## 2020-11-02 ENCOUNTER — Other Ambulatory Visit: Payer: Self-pay

## 2020-11-02 ENCOUNTER — Ambulatory Visit: Payer: Medicaid Other | Attending: Maternal & Fetal Medicine

## 2020-11-02 ENCOUNTER — Encounter: Payer: Self-pay | Admitting: *Deleted

## 2020-11-02 ENCOUNTER — Ambulatory Visit: Payer: Medicaid Other | Admitting: *Deleted

## 2020-11-02 VITALS — BP 113/68 | HR 75

## 2020-11-02 DIAGNOSIS — O365990 Maternal care for other known or suspected poor fetal growth, unspecified trimester, not applicable or unspecified: Secondary | ICD-10-CM | POA: Insufficient documentation

## 2020-11-02 DIAGNOSIS — O365931 Maternal care for other known or suspected poor fetal growth, third trimester, fetus 1: Secondary | ICD-10-CM | POA: Diagnosis present

## 2020-11-02 IMAGING — US US MFM UA CORD DOPPLER
1 series · 14 of 28 positions shown · non-contrast
Comparison: none

[Series 1: us mfm ua cord doppler · 34 acquisitions, 14 frames shown]
[im 2/34]
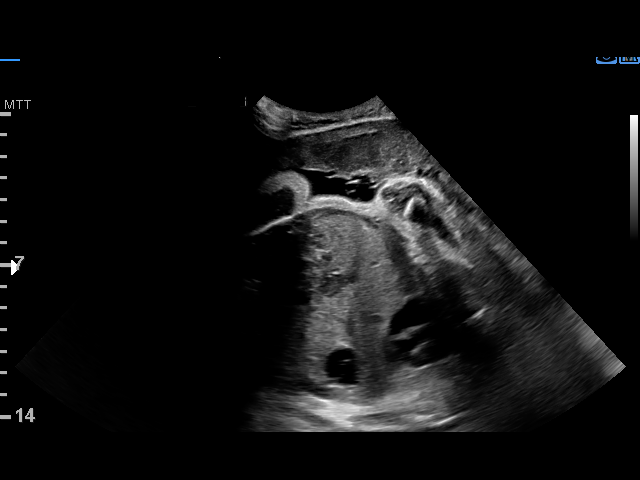
[im 4/34]
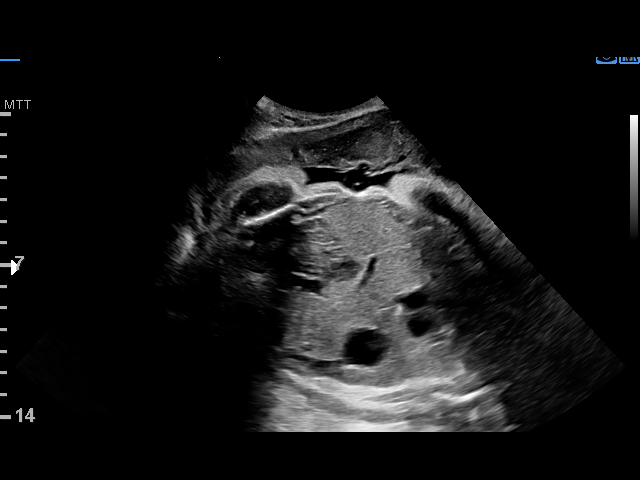
[im 7/34]
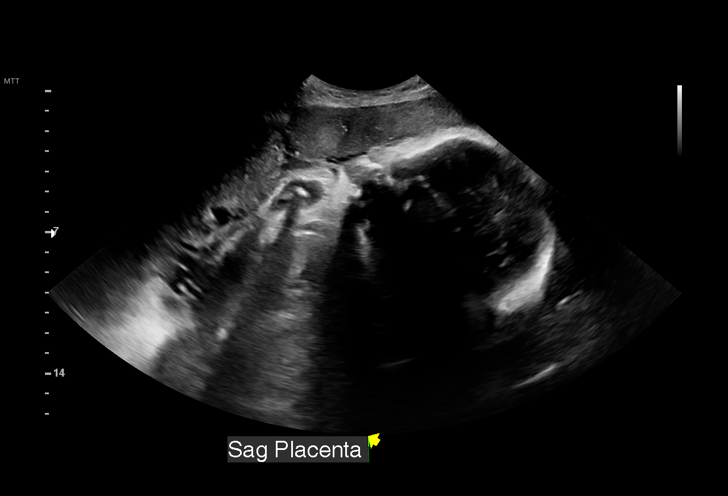
[im 9/34]
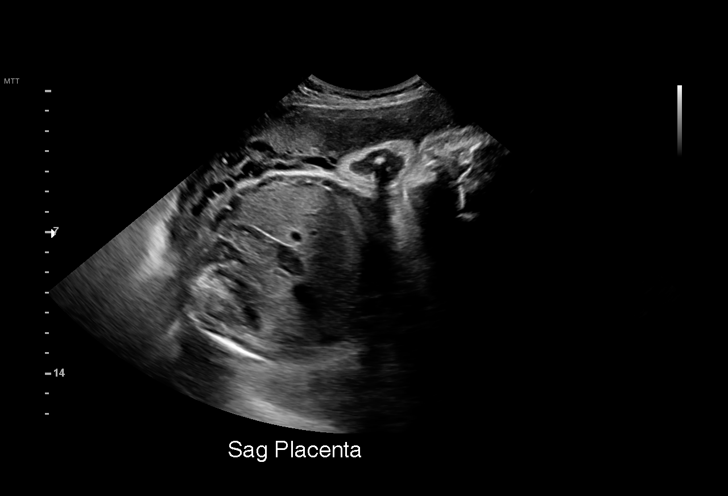
[im 12/34]
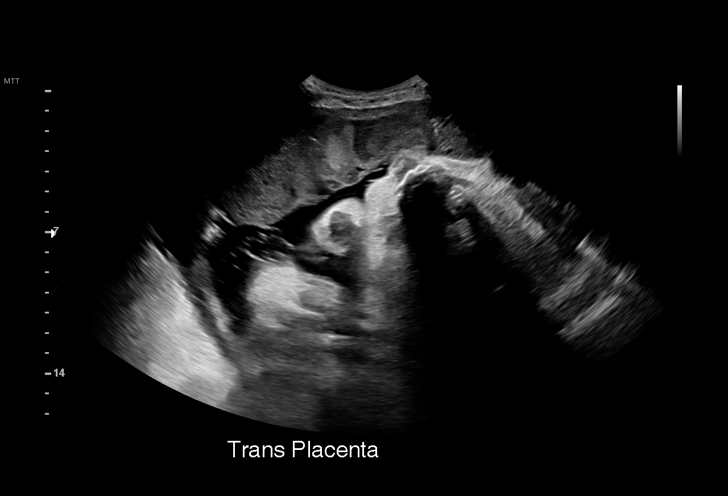
[im 14/34]
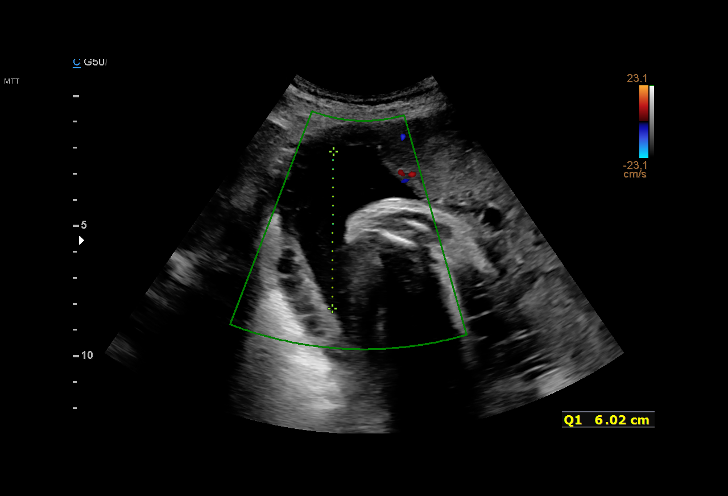
[im 16/34]
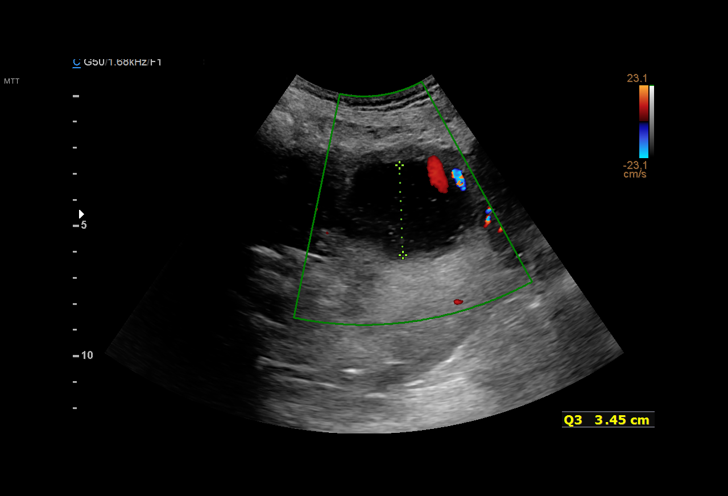
[im 19/34]
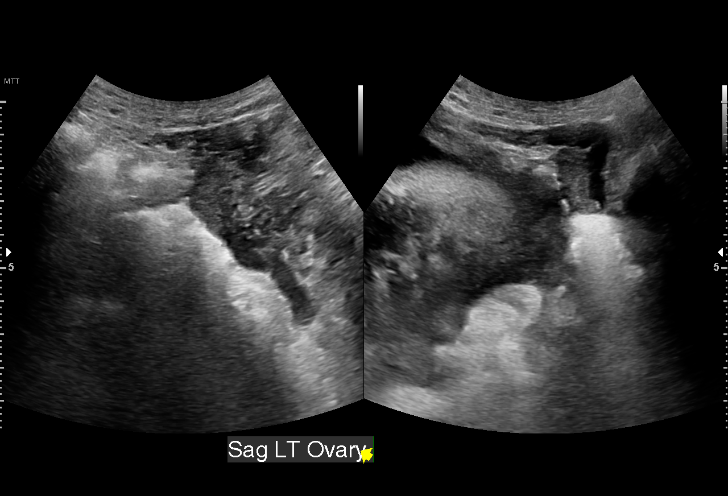
[im 21/34]
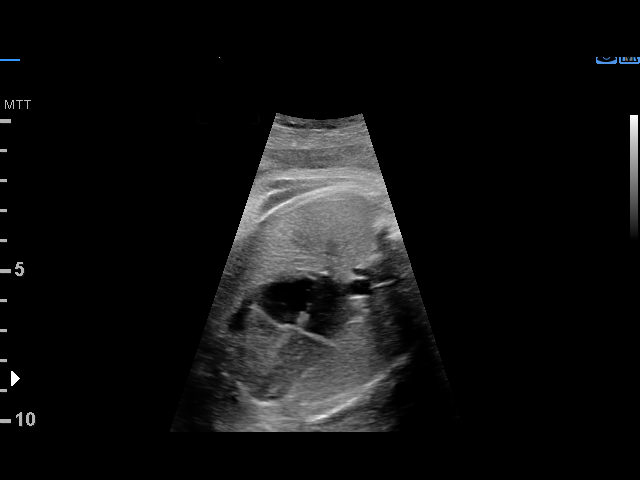
[im 24/34]
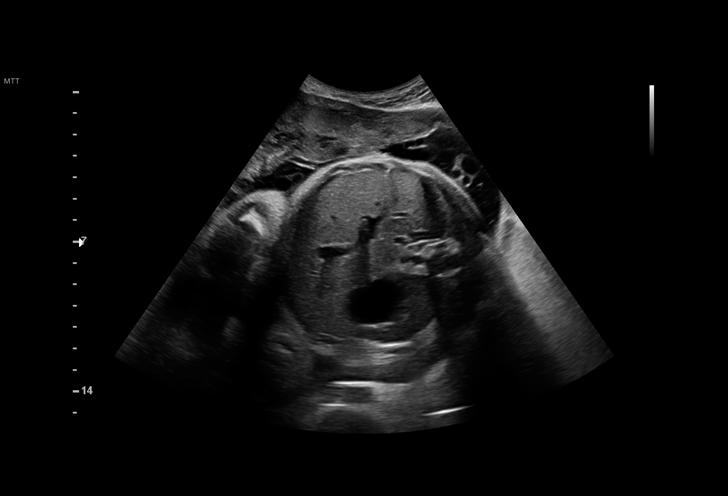
[im 26/34]
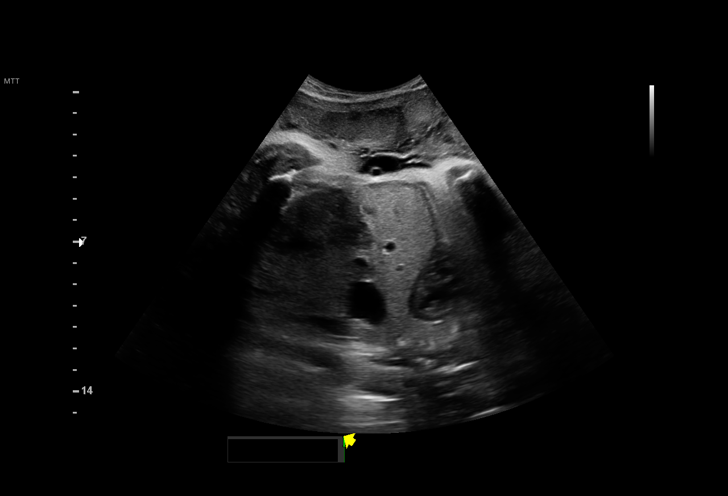
[im 29/34]
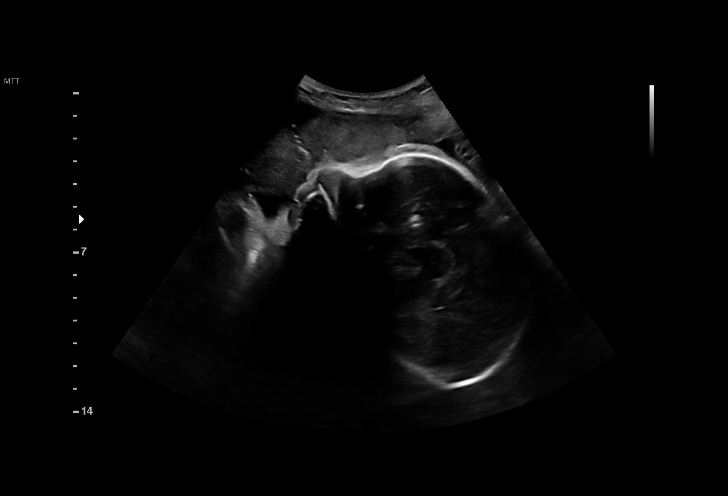
[im 31/34]
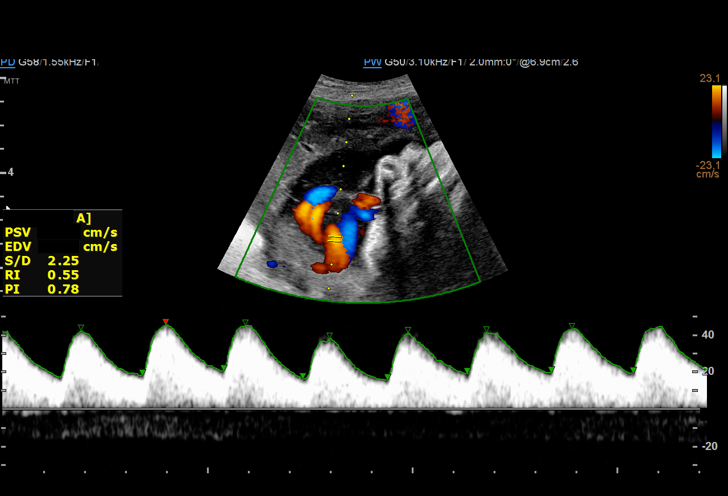
[im 34/34]
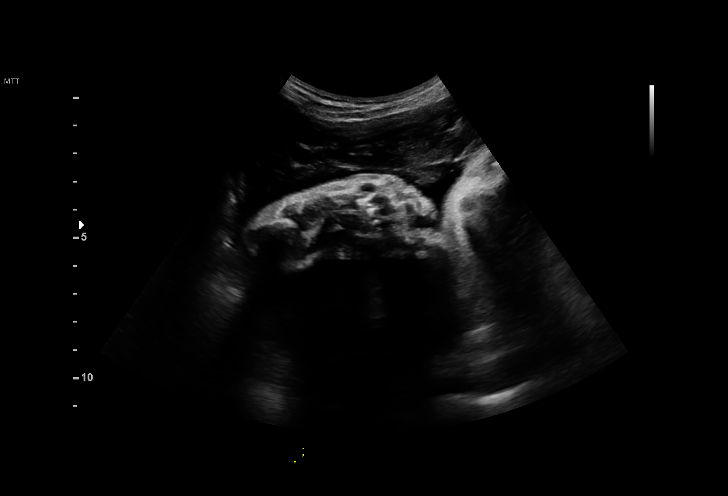

[14 of 28 positions shown; findings below may reference images not displayed]

OBGYN
                                                            [REDACTED]
                   DERAS

 1  US MFM UA CORD DOPPLER                76820.02    DERAS
                                                      DERAS

Indications

 36 weeks gestation of pregnancy
 Small for gestational age fetus affecting      [UD]
 management of mother
 Abnormal fetal ultrasound (? Amniotic band     [UD]
 on ED study)
 Previous cervical surgery (LEEP)               [UD]
 LR NIPS
 Abnormal fetal heart rate (Previous            O76
 Bradycardia noted on US [DATE])
 Encounter for other antenatal screening        [UD]
 follow-up
Fetal Evaluation

 Num Of Fetuses:         1
 Fetal Heart Rate(bpm):  133
 Cardiac Activity:       Observed
 Presentation:           Cephalic
 Placenta:               Anterior
 P. Cord Insertion:      Previously Visualized
 Amniotic Fluid
 AFI FV:      Within normal limits

 AFI Sum(cm)     %Tile       Largest Pocket(cm)
 14.3            52          6

 RUQ(cm)       RLQ(cm)       LUQ(cm)        LLQ(cm)
 6             2.8           2
Biophysical Evaluation

 Amniotic F.V:   Within normal limits       F. Tone:        Observed
 F. Movement:    Observed                   Score:          [DATE]
 F. Breathing:   Observed
OB History

 Blood Type:   A+
 Gravidity:    6         Term:   3        Prem:   0        SAB:   2
 TOP:          0       Ectopic:  0        Living: 3
Gestational Age

 LMP:           42w 3d        Date:  [DATE]                 EDD:   [DATE]
 Clinical EDD:  36w 0d                                        EDD:   [DATE]
 Best:          36w 0d     Det. By:  Clinical EDD             EDD:   [DATE]
Anatomy

 Thoracic:              Appears normal         Stomach:                Appears normal, left
                                                                       sided
 Heart:                 Appears normal         Kidneys:                Appear normal
                        (4CH, axis, and
                        situs)
 Diaphragm:             Appears normal         Bladder:                Appears normal
Doppler - Fetal Vessels

 Umbilical Artery
  S/D     %tile      RI    %tile      PI    %tile     PSV    ADFV    RDFV
                                                    (cm/s)
  2.74       71    0.63       75    0.[REDACTED]      No      No

Cervix Uterus Adnexa

 Cervix
 Not visualized (advanced GA >[UD])

 Uterus
 No abnormality visualized.

 Right Ovary
 Within normal limits.

 Left Ovary
 Within normal limits.
 Cul De Sac
 No free fluid seen.

 Adnexa
 No abnormality visualized.
Impression

 Fetal growth restriction.  Patient had abnormal umbilical
 Doppler studies and episodes of bradycardia on previous
 ultrasound.

 Amniotic fluid is normal good fetal activity seen.  Cephalic
 presentation.  Antenatal testing is reassuring.  BPP [DATE].
 Umbilical artery Doppler showed normal forward diastolic flow.

 I reassured the couple of the findings.

 Because of her recent episode of bradycardia, abnormal
 Doppler studies and fetal growth restriction, I recommend
 delivery at 37 weeks gestation.
Recommendations

 No follow-up appointments were ma[REDACTED]

## 2020-11-03 ENCOUNTER — Encounter (HOSPITAL_COMMUNITY): Payer: Self-pay | Admitting: *Deleted

## 2020-11-03 ENCOUNTER — Telehealth (HOSPITAL_COMMUNITY): Payer: Self-pay | Admitting: *Deleted

## 2020-11-03 NOTE — Telephone Encounter (Signed)
Preadmission screen  

## 2020-11-09 ENCOUNTER — Ambulatory Visit: Payer: Medicaid Other

## 2020-11-09 ENCOUNTER — Other Ambulatory Visit (HOSPITAL_COMMUNITY): Payer: Self-pay | Admitting: Diagnostic Radiology

## 2020-11-10 ENCOUNTER — Inpatient Hospital Stay (HOSPITAL_COMMUNITY): Payer: Medicaid Other

## 2020-11-10 ENCOUNTER — Other Ambulatory Visit: Payer: Self-pay

## 2020-11-10 ENCOUNTER — Encounter (HOSPITAL_COMMUNITY): Payer: Self-pay | Admitting: Obstetrics

## 2020-11-10 ENCOUNTER — Inpatient Hospital Stay (HOSPITAL_COMMUNITY)
Admission: AD | Admit: 2020-11-10 | Discharge: 2020-11-12 | DRG: 807 | Disposition: A | Discharge status: Home / Self Care | Payer: Medicaid Other | Attending: Obstetrics and Gynecology | Admitting: Obstetrics and Gynecology

## 2020-11-10 ENCOUNTER — Inpatient Hospital Stay (HOSPITAL_COMMUNITY): Payer: Medicaid Other | Admitting: Anesthesiology

## 2020-11-10 DIAGNOSIS — Z3A37 37 weeks gestation of pregnancy: Secondary | ICD-10-CM | POA: Diagnosis not present

## 2020-11-10 DIAGNOSIS — O365930 Maternal care for other known or suspected poor fetal growth, third trimester, not applicable or unspecified: Principal | ICD-10-CM | POA: Diagnosis present

## 2020-11-10 DIAGNOSIS — O99893 Other specified diseases and conditions complicating puerperium: Secondary | ICD-10-CM | POA: Diagnosis not present

## 2020-11-10 DIAGNOSIS — D62 Acute posthemorrhagic anemia: Secondary | ICD-10-CM | POA: Diagnosis not present

## 2020-11-10 DIAGNOSIS — O365990 Maternal care for other known or suspected poor fetal growth, unspecified trimester, not applicable or unspecified: Secondary | ICD-10-CM | POA: Diagnosis present

## 2020-11-10 DIAGNOSIS — R030 Elevated blood-pressure reading, without diagnosis of hypertension: Secondary | ICD-10-CM | POA: Diagnosis not present

## 2020-11-10 DIAGNOSIS — O9081 Anemia of the puerperium: Secondary | ICD-10-CM | POA: Diagnosis not present

## 2020-11-10 DIAGNOSIS — O9902 Anemia complicating childbirth: Secondary | ICD-10-CM | POA: Diagnosis present

## 2020-11-10 LAB — CBC
HCT: 33.5 % — ABNORMAL LOW (ref 36.0–46.0)
Hemoglobin: 11.3 g/dL — ABNORMAL LOW (ref 12.0–15.0)
MCH: 31.3 pg (ref 26.0–34.0)
MCHC: 33.7 g/dL (ref 30.0–36.0)
MCV: 92.8 fL (ref 80.0–100.0)
Platelets: 307 10*3/uL (ref 150–400)
RBC: 3.61 MIL/uL — ABNORMAL LOW (ref 3.87–5.11)
RDW: 13.3 % (ref 11.5–15.5)
WBC: 8.7 10*3/uL (ref 4.0–10.5)
nRBC: 0 % (ref 0.0–0.2)

## 2020-11-10 LAB — TYPE AND SCREEN
ABO/RH(D): A POS
Antibody Screen: NEGATIVE

## 2020-11-10 LAB — SARS CORONAVIRUS 2 (TAT 6-24 HRS): SARS Coronavirus 2: NEGATIVE

## 2020-11-10 LAB — RPR: RPR Ser Ql: NONREACTIVE

## 2020-11-10 MED ORDER — SOD CITRATE-CITRIC ACID 500-334 MG/5ML PO SOLN: 30.0000 mL | ORAL | Status: DC | PRN

## 2020-11-10 MED ORDER — BENZOCAINE-MENTHOL 20-0.5 % EX AERO: 1.0000 "application " | INHALATION_SPRAY | CUTANEOUS | Status: DC | PRN

## 2020-11-10 MED ORDER — TERBUTALINE SULFATE 1 MG/ML IJ SOLN: 0.2500 mg | Freq: Once | INTRAMUSCULAR | Status: DC | PRN

## 2020-11-10 MED ORDER — DIPHENHYDRAMINE HCL 50 MG/ML IJ SOLN: 12.5000 mg | INTRAMUSCULAR | Status: DC | PRN

## 2020-11-10 MED ORDER — EPHEDRINE 5 MG/ML INJ
10.0000 mg | INTRAVENOUS | Status: DC | PRN
Start: 2020-11-10 — End: 2020-11-10

## 2020-11-10 MED ORDER — LIDOCAINE HCL (PF) 1 % IJ SOLN: 30.0000 mL | INTRAMUSCULAR | Status: DC | PRN

## 2020-11-10 MED ORDER — PHENYLEPHRINE 40 MCG/ML (10ML) SYRINGE FOR IV PUSH (FOR BLOOD PRESSURE SUPPORT)
80.0000 ug | PREFILLED_SYRINGE | INTRAVENOUS | Status: DC | PRN
  Filled 2020-11-10: qty 10

## 2020-11-10 MED ORDER — SENNOSIDES-DOCUSATE SODIUM 8.6-50 MG PO TABS
2.0000 | ORAL_TABLET | Freq: Every day | ORAL | Status: DC
  Administered 2020-11-11 – 2020-11-12 (×2): 2 via ORAL
  Filled 2020-11-10 (×2): qty 2

## 2020-11-10 MED ORDER — MISOPROSTOL 25 MCG QUARTER TABLET
25.0000 ug | ORAL_TABLET | ORAL | Status: DC | PRN
  Administered 2020-11-10: 25 ug via VAGINAL
  Filled 2020-11-10: qty 1

## 2020-11-10 MED ORDER — OXYTOCIN-SODIUM CHLORIDE 30-0.9 UT/500ML-% IV SOLN
1.0000 m[IU]/min | INTRAVENOUS | Status: DC
  Administered 2020-11-10: 2 m[IU]/min via INTRAVENOUS
  Filled 2020-11-10: qty 500

## 2020-11-10 MED ORDER — DIBUCAINE (PERIANAL) 1 % EX OINT: 1.0000 "application " | TOPICAL_OINTMENT | CUTANEOUS | Status: DC | PRN

## 2020-11-10 MED ORDER — WITCH HAZEL-GLYCERIN EX PADS: 1.0000 "application " | MEDICATED_PAD | CUTANEOUS | Status: DC | PRN

## 2020-11-10 MED ORDER — LACTATED RINGERS IV SOLN
500.0000 mL | Freq: Once | INTRAVENOUS | Status: AC
  Administered 2020-11-10: 500 mL via INTRAVENOUS

## 2020-11-10 MED ORDER — ACETAMINOPHEN 325 MG PO TABS
650.0000 mg | ORAL_TABLET | ORAL | Status: DC | PRN
  Administered 2020-11-10: 650 mg via ORAL
  Filled 2020-11-10: qty 2

## 2020-11-10 MED ORDER — PHENYLEPHRINE 40 MCG/ML (10ML) SYRINGE FOR IV PUSH (FOR BLOOD PRESSURE SUPPORT): 80.0000 ug | PREFILLED_SYRINGE | INTRAVENOUS | Status: DC | PRN

## 2020-11-10 MED ORDER — IBUPROFEN 600 MG PO TABS
600.0000 mg | ORAL_TABLET | Freq: Four times a day (QID) | ORAL | Status: DC
  Administered 2020-11-10 – 2020-11-12 (×8): 600 mg via ORAL
  Filled 2020-11-10 (×8): qty 1

## 2020-11-10 MED ORDER — ONDANSETRON HCL 4 MG/2ML IJ SOLN: 4.0000 mg | Freq: Four times a day (QID) | INTRAMUSCULAR | Status: DC | PRN

## 2020-11-10 MED ORDER — FENTANYL-BUPIVACAINE-NACL 0.5-0.125-0.9 MG/250ML-% EP SOLN
12.0000 mL/h | EPIDURAL | Status: DC | PRN
  Administered 2020-11-10: 12 mL/h via EPIDURAL
  Filled 2020-11-10: qty 250

## 2020-11-10 MED ORDER — DIPHENHYDRAMINE HCL 25 MG PO CAPS: 25.0000 mg | ORAL_CAPSULE | Freq: Four times a day (QID) | ORAL | Status: DC | PRN

## 2020-11-10 MED ORDER — SIMETHICONE 80 MG PO CHEW
80.0000 mg | CHEWABLE_TABLET | ORAL | Status: DC | PRN
  Administered 2020-11-11: 80 mg via ORAL
  Filled 2020-11-10: qty 1

## 2020-11-10 MED ORDER — OXYTOCIN BOLUS FROM INFUSION
333.0000 mL | Freq: Once | INTRAVENOUS | Status: AC
  Administered 2020-11-10: 333 mL via INTRAVENOUS

## 2020-11-10 MED ORDER — OXYTOCIN-SODIUM CHLORIDE 30-0.9 UT/500ML-% IV SOLN
2.5000 [IU]/h | INTRAVENOUS | Status: DC
  Administered 2020-11-10: 2.5 [IU]/h via INTRAVENOUS

## 2020-11-10 MED ORDER — LIDOCAINE-EPINEPHRINE (PF) 1.5 %-1:200000 IJ SOLN
INTRAMUSCULAR | Status: DC | PRN
  Administered 2020-11-10: 5 mL via EPIDURAL

## 2020-11-10 MED ORDER — PRENATAL MULTIVITAMIN CH
1.0000 | ORAL_TABLET | Freq: Every day | ORAL | Status: DC
  Administered 2020-11-11 – 2020-11-12 (×2): 1 via ORAL
  Filled 2020-11-10 (×2): qty 1

## 2020-11-10 MED ORDER — OXYCODONE-ACETAMINOPHEN 5-325 MG PO TABS: 2.0000 | ORAL_TABLET | ORAL | Status: DC | PRN

## 2020-11-10 MED ORDER — LACTATED RINGERS IV SOLN: INTRAVENOUS | Status: DC

## 2020-11-10 MED ORDER — OXYCODONE-ACETAMINOPHEN 5-325 MG PO TABS: 1.0000 | ORAL_TABLET | ORAL | Status: DC | PRN

## 2020-11-10 MED ORDER — OXYCODONE HCL 5 MG PO TABS: 5.0000 mg | ORAL_TABLET | ORAL | Status: DC | PRN

## 2020-11-10 MED ORDER — LACTATED RINGERS IV SOLN: 500.0000 mL | INTRAVENOUS | Status: DC | PRN

## 2020-11-10 MED ORDER — FENTANYL CITRATE (PF) 100 MCG/2ML IJ SOLN: 50.0000 ug | INTRAMUSCULAR | Status: DC | PRN

## 2020-11-10 MED ORDER — OXYCODONE HCL 5 MG PO TABS: 10.0000 mg | ORAL_TABLET | ORAL | Status: DC | PRN

## 2020-11-10 MED ORDER — TETANUS-DIPHTH-ACELL PERTUSSIS 5-2.5-18.5 LF-MCG/0.5 IM SUSY: 0.5000 mL | PREFILLED_SYRINGE | Freq: Once | INTRAMUSCULAR | Status: DC

## 2020-11-10 MED ORDER — COCONUT OIL OIL: 1.0000 "application " | TOPICAL_OIL | Status: DC | PRN

## 2020-11-10 MED ORDER — ONDANSETRON HCL 4 MG/2ML IJ SOLN: 4.0000 mg | INTRAMUSCULAR | Status: DC | PRN

## 2020-11-10 MED ORDER — ZOLPIDEM TARTRATE 5 MG PO TABS: 5.0000 mg | ORAL_TABLET | Freq: Every evening | ORAL | Status: DC | PRN

## 2020-11-10 MED ORDER — ONDANSETRON HCL 4 MG PO TABS: 4.0000 mg | ORAL_TABLET | ORAL | Status: DC | PRN

## 2020-11-10 MED ORDER — ACETAMINOPHEN 325 MG PO TABS
650.0000 mg | ORAL_TABLET | ORAL | Status: DC | PRN
  Administered 2020-11-11 – 2020-11-12 (×2): 650 mg via ORAL
  Filled 2020-11-10 (×2): qty 2

## 2020-11-10 MED ORDER — EPHEDRINE 5 MG/ML INJ: 10.0000 mg | INTRAVENOUS | Status: DC | PRN

## 2020-11-10 NOTE — Anesthesia Procedure Notes (Signed)
Epidural Patient location during procedure: OB Start time: 11/10/2020 12:46 PM End time: 11/10/2020 12:56 PM  Staffing Anesthesiologist: Atilano Median, DO Performed: anesthesiologist   Preanesthetic Checklist Completed: patient identified, IV checked, site marked, risks and benefits discussed, surgical consent, monitors and equipment checked, pre-op evaluation and timeout performed  Epidural Patient position: sitting Prep: ChloraPrep Patient monitoring: heart rate, continuous pulse ox and blood pressure Approach: midline Location: L3-L4 Injection technique: LOR saline  Needle:  Needle type: Tuohy  Needle gauge: 17 G Needle length: 9 cm Needle insertion depth: 7 cm Catheter type: closed end flexible Catheter size: 20 Guage Catheter at skin depth: 12 cm Test dose: negative and 1.5% lidocaine  Assessment Events: blood not aspirated, injection not painful, no injection resistance and no paresthesia  Additional Notes Patient identified. Risks/Benefits/Options discussed with patient including but not limited to bleeding, infection, nerve damage, paralysis, failed block, incomplete pain control, headache, blood pressure changes, nausea, vomiting, reactions to medications, itching and postpartum back pain. Confirmed with bedside nurse the patient's most recent platelet count. Confirmed with patient that they are not currently taking any anticoagulation, have any bleeding history or any family history of bleeding disorders. Patient expressed understanding and wished to proceed. All questions were answered. Sterile technique was used throughout the entire procedure. Please see nursing notes for vital signs. Test dose was given through epidural catheter and negative prior to continuing to dose epidural or start infusion. Warning signs of high block given to the patient including shortness of breath, tingling/numbness in hands, complete motor block, or any concerning symptoms with  instructions to call for help. Patient was given instructions on fall risk and not to get out of bed. All questions and concerns addressed with instructions to call with any issues or inadequate analgesia.    Reason for block:procedure for pain

## 2020-11-10 NOTE — Anesthesia Preprocedure Evaluation (Signed)
Anesthesia Evaluation  Patient identified by MRN, date of birth, ID band Patient awake    Reviewed: Allergy & Precautions, NPO status , Patient's Chart, lab work & pertinent test results  Airway Mallampati: II  TM Distance: >3 FB Neck ROM: Full    Dental  (+) Teeth Intact   Pulmonary neg pulmonary ROS,    Pulmonary exam normal        Cardiovascular negative cardio ROS   Rhythm:Regular Rate:Normal     Neuro/Psych Anxiety negative neurological ROS     GI/Hepatic negative GI ROS, Neg liver ROS,   Endo/Other  negative endocrine ROS  Renal/GU negative Renal ROS  negative genitourinary   Musculoskeletal negative musculoskeletal ROS (+)   Abdominal (+)  Abdomen: soft. Bowel sounds: normal.  Peds  Hematology negative hematology ROS (+)   Anesthesia Other Findings   Reproductive/Obstetrics (+) Pregnancy                             Anesthesia Physical Anesthesia Plan  ASA: 2  Anesthesia Plan: Epidural   Post-op Pain Management:    Induction:   PONV Risk Score and Plan: 2 and Treatment may vary due to age or medical condition  Airway Management Planned: Natural Airway  Additional Equipment: None  Intra-op Plan:   Post-operative Plan:   Informed Consent: I have reviewed the patients History and Physical, chart, labs and discussed the procedure including the risks, benefits and alternatives for the proposed anesthesia with the patient or authorized representative who has indicated his/her understanding and acceptance.     Dental advisory given  Plan Discussed with:   Anesthesia Plan Comments: (Lab Results      Component                Value               Date                      WBC                      8.7                 11/10/2020                HGB                      11.3 (L)            11/10/2020                HCT                      33.5 (L)            11/10/2020                 MCV                      92.8                11/10/2020                PLT                      307  11/10/2020           Lab Results      Component                Value               Date                      NA                       133 (L)             07/02/2020                K                        3.7                 07/02/2020                CO2                      20 (L)              07/02/2020                GLUCOSE                  105 (H)             07/02/2020                BUN                      10                  07/02/2020                CREATININE               0.57                07/02/2020                CALCIUM                  9.2                 07/02/2020                GFRNONAA                 >60                 07/02/2020                GFRAA                    >60                 12/25/2019          )        Anesthesia Quick Evaluation

## 2020-11-10 NOTE — H&P (Signed)
35 y.o. [redacted]w[redacted]d  [id number] comes in c/o IOL for IUGR.  Otherwise has good fetal movement and no bleeding.  Past Medical History:  Diagnosis Date   Anxiety    Vaginal Pap smear, abnormal     Past Surgical History:  Procedure Laterality Date   LEEP      OB History  Gravida Para Term Preterm AB Living  6 3 3   2 3   SAB IAB Ectopic Multiple Live Births  2       3    # Outcome Date GA Lbr Len/2nd Weight Sex Delivery Anes PTL Lv  6 Current           5 Term           4 Term           3 Term           2 SAB           1 SAB             Social History   Socioeconomic History   Marital status: Married    Spouse name:   Number of children: 3   Years of education: Not on file   Highest education level: Some college, no degree  Occupational History   Not on file  Tobacco Use   Smoking status: Never   Smokeless tobacco: Never  Vaping Use   Vaping Use: Never used  Substance and Sexual Activity   Alcohol use: Not Currently   Drug use: Never   Sexual activity: Yes  Other Topics Concern   Not on file  Social History Narrative   Not on file   Social Determinants of Health   Financial Resource Strain: Not on file  Food Insecurity: Not on file  Transportation Needs: Not on file  Physical Activity: Not on file  Stress: Not on file  Social Connections: Not on file  Intimate Partner Violence: Not on file   Tramadol    Prenatal Transfer Tool  Maternal Diabetes: No Genetic Screening: Normal Maternal Ultrasounds/Referrals: Other: initial scan showing uterine synechiae, not seen on f/u scan, noted above the fetus Fetal Ultrasounds or other Referrals:  Referred to Materal Fetal Medicine  for IUGR, last EFW 7% on 7/12.  Weekly ANT with 7/25 UAD wnl Maternal Substance Abuse:  No Significant Maternal Medications:  None Significant Maternal Lab Results: Group B Strep negative  Other PNC: IUGR, h.o LEEP    Vitals:   11/10/20 0750 11/10/20 0756  BP: (!) 142/85    Pulse: 91   Resp: 16   Temp: 98.4 F (36.9 C)   TempSrc: Oral   SpO2: 98%   Weight:  53.3 kg  Height:  5' 4.5" (1.638 m)    Lungs/Cor:  NAD Abdomen:  soft, gravid Ex:  no cords, erythema SVE:  1.5/50/B FHTs:  125 good STV, NST R; Cat 1 tracing. Toco:  occ   A/P   Admitted for IOL 37.1 with IUGR  GBS Neg  Mild BP 142/85 at admission, monitor  Cytotec placed at admission, will continue cervical ripening and anticipate AROM/pitocin  Epidural if/when desired  Other routine care  02-20-1980

## 2020-11-10 NOTE — Progress Notes (Signed)
Patient comfortable with epidural FHT: 135 mod var, +accels no decels TOCO: q2-3 SVE: 3/80/-1 A/P: AROM clear, with bloody show Continue pitocin 2x2 as needed Other routine care

## 2020-11-10 NOTE — Lactation Note (Signed)
This note was copied from a baby's chart. Lactation Consultation Note  Patient Name: Susan Neal [id number] Date: 11/10/2020 Reason for consult: L&D Initial assessment;Early term 37-38.6wks Age:34 hours  LC assisted latching infant in prone position for 5 min. Infant would not relatch. Parents offering drops of colostrum via finger or at breast.  LC reviewed feeding based on cues 8-12x 24 hr period no more than 3 hrs without an attempt.  Mom to receive further LC support on the floor. LC to consider getting mother pumping early given hx of low milk supply.   Maternal Data Has patient been taught Hand Expression?: Yes Does the patient have breastfeeding experience prior to this delivery?: Yes How long did the patient breastfeed?: Mom tried breastfeeding but stated lacked support, stress related and low milk supply impacted her ability to be successful  Feeding Mother's Current Feeding Choice: Breast Milk and Formula  LATCH Score Latch: Repeated attempts needed to sustain latch, nipple held in mouth throughout feeding, stimulation needed to elicit sucking reflex.  Audible Swallowing: A few with stimulation  Type of Nipple: Everted at rest and after stimulation (breasts are small and widley spaced. Infant able to latch in laid back position prone with breast compression.)  Comfort (Breast/Nipple): Soft / non-tender  Hold (Positioning): Assistance needed to correctly position infant at breast and maintain latch.  LATCH Score: 7   Lactation Tools Discussed/Used    Interventions Interventions: Breast feeding basics reviewed;Support pillows;Education;Assisted with latch;Position options;Skin to skin;Expressed milk;Breast massage;Hand express;Adjust position;Breast compression  Discharge    Consult Status Consult Status: Follow-up Date: 11/11/20 Follow-up type: In-patient    Chany Woolworth  Nicholson-Springer 11/10/2020, 8:03 PM

## 2020-11-11 LAB — CBC
HCT: 26.9 % — ABNORMAL LOW (ref 36.0–46.0)
Hemoglobin: 8.9 g/dL — ABNORMAL LOW (ref 12.0–15.0)
MCH: 31.4 pg (ref 26.0–34.0)
MCHC: 33.1 g/dL (ref 30.0–36.0)
MCV: 95.1 fL (ref 80.0–100.0)
Platelets: 187 10*3/uL (ref 150–400)
RBC: 2.83 MIL/uL — ABNORMAL LOW (ref 3.87–5.11)
RDW: 13.7 % (ref 11.5–15.5)
WBC: 12.7 10*3/uL — ABNORMAL HIGH (ref 4.0–10.5)
nRBC: 0 % (ref 0.0–0.2)

## 2020-11-11 MED ORDER — FERROUS SULFATE 325 (65 FE) MG PO TABS
325.0000 mg | ORAL_TABLET | Freq: Every day | ORAL | Status: DC
  Administered 2020-11-11 – 2020-11-12 (×2): 325 mg via ORAL
  Filled 2020-11-11 (×2): qty 1

## 2020-11-11 NOTE — Lactation Note (Signed)
This note was copied from a baby's chart. Lactation Consultation Note  Patient Name: Susan Neal [id number] Date: 11/11/2020 Reason for consult: Initial assessment;Mother's request;Early term 37-38.6wks;Infant < 6lbs;Infant weight loss;Difficult latch;Nipple pain/trauma Age:34 hours  LC not able to see a latch infant fed 15 ml of formula before arrival. Mom to call for latch assistance with next feeding.   LPTI infant guidelines reviewed with mother.  Mother some redness and pain from pumping. Flange size reduced to 21. Mom to use EBM, comfort gels and coconut oil for nipple care.  Plan 1. To feed based on cues 8-12x in 24 hr period no more than 3 hrs without an attempt. Mom to offer both breasts and look for signs of milk transfer. 2. Mom to supplement with EBM first followed by formula with pace bottle feeding ( parents using their own bottle and nipple) 3 Mom to pump with DEBP q 3 hrs for 15 min 4. I and O sheet reviewed.  5. LC brochure of inpatient and outpatient services reviewed.     Maternal Data Has patient been taught Hand Expression?: Yes Does the patient have breastfeeding experience prior to this delivery?: Yes How long did the patient breastfeed?: 2 weeks lack support and low milk supply  Feeding Mother's Current Feeding Choice: Breast Milk and Formula  LATCH Score                    Lactation Tools Discussed/Used Tools: Pump;Flanges;Coconut oil;Comfort gels Flange Size: 21 (Mom using 24 flange complaining of nipple pain. LC reduced to 21 flange mother stated comfortable fit) Breast pump type: Double-Electric Breast Pump Pump Education: Setup, frequency, and cleaning;Milk Storage Reason for Pumping: increase stimulation Pumping frequency: every 3 hrs for 15 min  Interventions Interventions: Breast feeding basics reviewed;Skin to skin;DEBP;Breast massage;Position options;Hand express;Expressed milk;Education;Coconut oil;Comfort  gels  Discharge Discharge Education: Engorgement and breast care;Warning signs for feeding baby Pump: Personal  Consult Status Consult Status: Follow-up Date: 11/12/20 Follow-up type: In-patient    Susan Neal  Nicholson-Springer 11/11/2020, 8:07 PM

## 2020-11-11 NOTE — Progress Notes (Signed)
Post Partum Day 1 Subjective: Susan Neal is doing well this morning without complaints. She is ambulating, voiding, and tolerating PO. Lochia is appropriate. Pain is well controlled.   Objective: Patient Vitals for the past 24 hrs:  BP Temp Temp src Pulse Resp SpO2  11/11/20 0630 114/72 98.2 F (36.8 C) Oral 65 18 100 %  11/11/20 0115 114/63 97.6 F (36.4 C) Oral 93 18 98 %  11/10/20 2120 124/68 98.6 F (37 C) Oral 84 17 100 %  11/10/20 2015 112/75 99.7 F (37.6 C) Oral 69 16 --  11/10/20 1945 121/72 -- -- 88 16 --  11/10/20 1930 119/68 -- -- 73 16 --  11/10/20 1800 118/65 98.2 F (36.8 C) Oral 67 16 100 %  11/10/20 1731 120/81 -- -- 67 -- 100 %  11/10/20 1700 (!) 107/59 -- -- 64 -- --  11/10/20 1630 (!) 106/57 98.1 F (36.7 C) Oral 69 16 98 %  11/10/20 1600 118/72 -- -- 83 16 99 %  11/10/20 1530 103/67 -- -- 75 -- 100 %  11/10/20 1500 114/63 -- -- 80 -- 100 %  11/10/20 1430 108/76 -- -- 66 16 100 %  11/10/20 1400 (!) 104/58 -- -- 70 -- --  11/10/20 1330 111/60 -- -- 66 -- 100 %  11/10/20 1325 (!) 107/56 -- -- 66 -- 99 %  11/10/20 1320 118/68 -- -- 71 -- 99 %  11/10/20 1315 (!) 107/58 -- -- 67 -- --  11/10/20 1310 (!) 110/55 -- -- 69 -- --  11/10/20 1305 113/61 -- -- 71 -- --  11/10/20 1302 (!) 109/54 -- -- 76 -- --  11/10/20 1300 (!) 130/59 -- -- 87 -- --  11/10/20 1257 140/73 -- -- 91 -- --    Physical Exam:  General: alert, cooperative, and no distress Lochia: appropriate Uterine Fundus: firm DVT Evaluation: No evidence of DVT seen on physical exam. Negative Homan's sign.  Recent Labs    11/10/20 0758 11/11/20 0556  WBC 8.7 12.7*  HGB 11.3* 8.9*  HCT 33.5* 26.9*  PLT 307 187    No results for input(s): NA, K, CL, CO2CT, BUN, CREATININE, GLUCOSE, BILITOT, ALT, AST, ALKPHOS, PROT, ALBUMIN in the last 72 hours.  No results for input(s): CALCIUM, MG, PHOS in the last 72 hours.  No results for input(s): PROTIME, APTT, INR in the last 72 hours.  No results for  input(s): PROTIME, APTT, INR, FIBRINOGEN in the last 72 hours. Assessment/Plan:  Susan Neal 35 y.o. [id number] PPD#1 sp SVD 1. PPC: continue routine postpartum care 2. Acute blood loss anemia: Hgb 8.9, start PO iron today 3. Rh positive, rubnella immune. S/p tdap prenatally. Declined COVID vaccines.  4. Dispo: patient desires discharge this evening at 24 hours if baby is cleared for discharge by pediatrician, otherwise will plan discharge PPD2. Is moving to [place] in 3 days.    LOS: 1 day   Charlett Nose 11/11/2020, 11:09 AM

## 2020-11-11 NOTE — Anesthesia Postprocedure Evaluation (Signed)
Anesthesia Post Note  Patient: Toya Palacios  Procedure(s) Performed: AN AD HOC LABOR EPIDURAL     Patient location during evaluation: Mother Baby Anesthesia Type: Epidural Level of consciousness: awake and alert Pain management: pain level controlled Vital Signs Assessment: post-procedure vital signs reviewed and stable Respiratory status: spontaneous breathing, nonlabored ventilation and respiratory function stable Cardiovascular status: stable Postop Assessment: no headache, no backache and epidural receding Anesthetic complications: no   No notable events documented.  Last Vitals:  Vitals:   11/11/20 0115 11/11/20 0630  BP: 114/63 114/72  Pulse: 93 65  Resp: 18 18  Temp: 36.4 C 36.8 C  SpO2: 98% 100%    Last Pain:  Vitals:   11/11/20 0735  TempSrc:   PainSc: 2    Pain Goal:                   Opha Mcghee

## 2020-11-12 DIAGNOSIS — D62 Acute posthemorrhagic anemia: Secondary | ICD-10-CM

## 2020-11-12 LAB — SURGICAL PATHOLOGY

## 2020-11-12 MED ORDER — ACETAMINOPHEN 325 MG PO TABS: 650.0000 mg | ORAL_TABLET | ORAL | 1 refills | Status: AC | PRN

## 2020-11-12 MED ORDER — IBUPROFEN 600 MG PO TABS: 600.0000 mg | ORAL_TABLET | Freq: Four times a day (QID) | ORAL | 1 refills | Status: AC

## 2020-11-12 MED ORDER — FERROUS SULFATE 325 (65 FE) MG PO TABS: 325.0000 mg | ORAL_TABLET | Freq: Every day | ORAL | 3 refills | Status: AC

## 2020-11-12 NOTE — Lactation Note (Signed)
This note was copied from a baby's chart. Lactation Consultation Note  Patient Name: Susan Neal [id number] Date: 11/12/2020 Reason for consult: Follow-up assessment;Early term 37-38.6wks;Infant < 6lbs;Infant weight loss;Other (Comment) (8 % weight loss/ dyad for D/C) Age:34 hours Per mom baby recently fed at the breast for 10 mins and then supplemented with  10 ml of formula.  Per mom the #21 Flange feels much better.  LC reviewed the doc flow sheets with parents and updated.  LC reviewed BF D/C teaching for LPT , > 6 pound infant with 8 % weight loss.  Baby asleep, recently fed and LC unable to assess feeding.   LC plan:  Late Pre term feeding plan -  \feed with feeding cues and by 3 hours ( 8- 12 times in 24 hours )  STS feedings.  Due to 8 % weight loss - Until baby gaining well and above 6 pounds  Feed 1st breast 15 -20 mins , supplement with at least 30 ml of EBM  Or formula.  Mom reassured mom baby would eventually would just be breast feeding,but for now the above plan is needed to enhance steady growth and maintain energy level. Mom has the [facility] brochure with phone resources.   Maternal Data    Feeding Mother's Current Feeding Choice: Breast Milk and Formula Nipple Type:  (mom using her Avent from home)  [facility] Score                    Lactation Tools Discussed/Used Tools: Shells;Pump;Flanges;Coconut oil;Comfort gels Flange Size: 21;24 Breast pump type: Manual;Double-Electric Breast Pump Pump Education: Milk Storage  Interventions Interventions: Breast feeding basics reviewed;Education  Discharge Discharge Education: Engorgement and breast care;Warning signs for feeding baby Pump: Personal;Manual;DEBP  Consult Status Consult Status: Complete Date: 11/12/20    Susan Neal 11/12/2020, 10:14 AM

## 2020-11-12 NOTE — Discharge Summary (Signed)
Postpartum Discharge Summary    Patient Name: Susan Neal DOB: Dec 14, 1985 MRN: [id number]  Date of admission: 11/10/2020 Delivery date:11/10/2020  Delivering provider: Allyn Kenner  Date of discharge: 11/12/2020  Admitting diagnosis: Pregnancy affected by fetal growth restriction [O36.5990] Intrauterine pregnancy: [redacted]w[redacted]d    Secondary diagnosis:  Active Problems:   Pregnancy affected by fetal growth restriction   SVD (spontaneous vaginal delivery)   ABLA (acute blood loss anemia)  Additional problems: none    Discharge diagnosis: Term Pregnancy Delivered and Anemia                                              Post partum procedures: none Augmentation: AROM, Pitocin, and Cytotec Complications: None  Hospital course: Induction of Labor With Vaginal Delivery   35y.o. yo [id number] 33w2das admitted to the hospital 11/10/2020 for induction of labor.  Indication for induction:  fetal growth restriction .  Patient had an uncomplicated labor course as follows: Membrane Rupture Time/Date: 5:28 PM ,11/10/2020   Delivery Method:Vaginal, Spontaneous  Episiotomy: None  Lacerations:  None;Labial  Details of delivery can be found in separate delivery note.  Patient had a routine postpartum course. Patient is discharged home 11/12/20.  Newborn Data: Birth date:11/10/2020  Birth time:7:04 PM  Gender:Female  Living status:Living  Apgars:9 ,9  Weight:2611 g   Magnesium Sulfate received: No BMZ received: Yes - prior admit 7/19 and 7/20 Rhophylac:N/A MMR:N/A - rubella immune T-DaP:Given prenatally Transfusion:No  Physical exam  Vitals:   11/11/20 0630 11/11/20 1311 11/11/20 2245 11/12/20 0520  BP: 114/72 104/73 101/68 103/62  Pulse: 65 67 66 63  Resp: _0 Temp: 98.2 F (36.8 C) 98.5 F (36.9 C) 98.6 F (37 C) 98.1 F (36.7 C)  TempSrc: Oral Oral Oral Oral  SpO2: 100% 99%    Weight:      Height:       General: alert and no distress Lochia: appropriate Uterine  Fundus: firm DVT Evaluation: No evidence of DVT seen on physical exam. Labs: Lab Results  Component Value Date   WBC 12.7 (H) 11/11/2020   HGB 8.9 (L) 11/11/2020   HCT 26.9 (L) 11/11/2020   MCV 95.1 11/11/2020   PLT 187 11/11/2020   CMP Latest Ref Rng & Units 07/02/2020  Glucose 70 - 99 mg/dL 105(H)  BUN 6 - 20 mg/dL 10  Creatinine 0.44 - 1.00 mg/dL 0.57  Sodium 135 - 145 mmol/L 133(L)  Potassium 3.5 - 5.1 mmol/L 3.7  Chloride 98 - 111 mmol/L 105  CO2 22 - 32 mmol/L 20(L)  Calcium 8.9 - 10.3 mg/dL 9.2  Total Protein 6.5 - 8.1 g/dL 7.9  Total Bilirubin 0.3 - 1.2 mg/dL 0.6  Alkaline Phos 38 - 126 U/L 47  AST 15 - 41 U/L 19  ALT 0 - 44 U/L 14   Edinburgh Score: Edinburgh Postnatal Depression Scale Screening Tool 11/11/2020  I have been able to laugh and see the funny side of things. 0  I have looked forward with enjoyment to things. 0  I have blamed myself unnecessarily when things went wrong. 2  I have been anxious or worried for no good reason. 1  I have felt scared or panicky for no good reason. 0  Things have been getting on top of me. 1  I have been so unhappy  that I have had difficulty sleeping. 0  I have felt sad or miserable. 0  I have been so unhappy that I have been crying. 0  The thought of harming myself has occurred to me. 0  Edinburgh Postnatal Depression Scale Total 4      After visit meds:  Allergies as of 11/12/2020       Reactions   Tramadol Nausea And Vomiting        Medication List     STOP taking these medications    ondansetron 8 MG disintegrating tablet Commonly known as: ZOFRAN-ODT       TAKE these medications    acetaminophen 325 MG tablet Commonly known as: Tylenol Take 2 tablets (650 mg total) by mouth every 4 (four) hours as needed (for pain scale < 4). What changed:  when to take this reasons to take this   ferrous sulfate 325 (65 FE) MG tablet Take 1 tablet (325 mg total) by mouth daily with breakfast. Start taking on:  November 13, 2020   ibuprofen 600 MG tablet Commonly known as: ADVIL Take 1 tablet (600 mg total) by mouth every 6 (six) hours.   PRENATAL GUMMIES PO Take by mouth.         Discharge home in stable condition Infant Feeding: Bottle and Breast Infant Disposition:rooming in Discharge instruction: per After Visit Summary and Postpartum booklet. Activity: Advance as tolerated. Pelvic rest for 6 weeks.  Diet: routine diet Anticipated Birth Control: Unsure Postpartum Appointment: Recommend PP visit 4-6w but patient is moving to [place] this weekend. Reports Dr. Rogue Bussing offered visit prior to move Additional Postpartum F/U:  none Future Appointments:No future appointments. Follow up Visit:  Follow-up Information     Allyn Kenner, DO Follow up.   Specialty: Obstetrics and Gynecology Why: Before you move Contact information: [address] [place] [address] [phone]                     11/12/2020 Jonelle Sidle, MD

## 2020-11-12 NOTE — Progress Notes (Signed)
Post Partum Day 2 Subjective: no complaints, up ad lib, voiding, and tolerating PO  Objective: Patient Vitals for the past 24 hrs:  BP Temp Temp src Pulse Resp SpO2  11/12/20 0520 103/62 98.1 F (36.7 C) Oral 63 18 --  11/11/20 2245 101/68 98.6 F (37 C) Oral 66 18 --  11/11/20 1311 104/73 98.5 F (36.9 C) Oral 67 17 99 %    Physical Exam:  General: alert and no distress Lochia: appropriate Uterine Fundus: firm DVT Evaluation: No evidence of DVT seen on physical exam.  Recent Labs    11/10/20 0758 11/11/20 0556  WBC 8.7 12.7*  HGB 11.3* 8.9*  HCT 33.5* 26.9*  PLT 307 187   Assessment/Plan: Discharge home  Susan Neal 35 y.o. [id number] PPD#2 sp SVD at [redacted]w[redacted]d for FGR PPC: routine PPC ABLA: Hgb 11.3>8.9, EBL 100cc, on PO iron Newly elevated BP on admit, otherwise BP normotensive. Asymptomatic for preeclampsia Rubella Immune, blood type A POS, breast & bottle feeding, baby girl in room, birth control - undecided, discussed options. Vaccines: tdap completed prenatally. Recommended COVID series, patient declines   LOS: 2 days   Eland Lamantia K Taam-Akelman 11/12/2020, 8:20 AM
